# Patient Record
Sex: Male | Born: 1955 | Race: White | Hispanic: No | Marital: Married | Smoking: Former smoker
Health system: Southern US, Community
[De-identification: ages and names within clinical notes are randomized; demographics above are authoritative.]

## PROBLEM LIST (undated history)

## (undated) DIAGNOSIS — J449 Chronic obstructive pulmonary disease, unspecified: Secondary | ICD-10-CM

## (undated) DIAGNOSIS — J310 Chronic rhinitis: Secondary | ICD-10-CM

## (undated) DIAGNOSIS — J439 Emphysema, unspecified: Secondary | ICD-10-CM

## (undated) DIAGNOSIS — T7840XA Allergy, unspecified, initial encounter: Secondary | ICD-10-CM

## (undated) DIAGNOSIS — J45909 Unspecified asthma, uncomplicated: Secondary | ICD-10-CM

## (undated) HISTORY — DX: Allergy, unspecified, initial encounter: T78.40XA

## (undated) HISTORY — DX: Chronic obstructive pulmonary disease, unspecified: J44.9

## (undated) HISTORY — DX: Unspecified asthma, uncomplicated: J45.909

## (undated) HISTORY — DX: Emphysema, unspecified: J43.9

## (undated) HISTORY — DX: Chronic rhinitis: J31.0

---

## 2000-04-14 ENCOUNTER — Encounter: Admission: RE | Admit: 2000-04-14 | Discharge: 2000-04-14 | Payer: Self-pay | Admitting: Family Medicine

## 2000-04-14 ENCOUNTER — Encounter: Payer: Self-pay | Admitting: Family Medicine

## 2001-08-05 ENCOUNTER — Encounter: Admission: RE | Admit: 2001-08-05 | Discharge: 2001-08-05 | Payer: Self-pay | Admitting: Family Medicine

## 2001-08-05 ENCOUNTER — Encounter: Payer: Self-pay | Admitting: Family Medicine

## 2002-12-20 ENCOUNTER — Inpatient Hospital Stay (HOSPITAL_COMMUNITY): Admission: AD | Admit: 2002-12-20 | Discharge: 2002-12-23 | Payer: Self-pay | Admitting: Internal Medicine

## 2002-12-22 ENCOUNTER — Encounter: Payer: Self-pay | Admitting: Cardiovascular Disease

## 2005-01-04 ENCOUNTER — Ambulatory Visit: Payer: Self-pay | Admitting: Internal Medicine

## 2005-07-10 ENCOUNTER — Ambulatory Visit: Payer: Self-pay | Admitting: Internal Medicine

## 2006-02-14 ENCOUNTER — Ambulatory Visit: Payer: Self-pay | Admitting: Internal Medicine

## 2006-05-07 ENCOUNTER — Ambulatory Visit: Payer: Self-pay | Admitting: Internal Medicine

## 2006-07-03 ENCOUNTER — Ambulatory Visit: Payer: Self-pay | Admitting: Gastroenterology

## 2006-07-11 ENCOUNTER — Ambulatory Visit: Payer: Self-pay | Admitting: Gastroenterology

## 2007-03-30 ENCOUNTER — Ambulatory Visit: Payer: Self-pay | Admitting: Internal Medicine

## 2007-06-01 ENCOUNTER — Ambulatory Visit: Payer: Self-pay | Admitting: Internal Medicine

## 2007-06-05 ENCOUNTER — Ambulatory Visit: Payer: Self-pay | Admitting: Internal Medicine

## 2007-06-10 ENCOUNTER — Ambulatory Visit: Payer: Self-pay | Admitting: Internal Medicine

## 2007-07-01 ENCOUNTER — Ambulatory Visit: Payer: Self-pay | Admitting: Internal Medicine

## 2007-07-02 ENCOUNTER — Ambulatory Visit: Payer: Self-pay | Admitting: Internal Medicine

## 2007-09-04 ENCOUNTER — Ambulatory Visit: Payer: Self-pay | Admitting: Internal Medicine

## 2007-10-27 DIAGNOSIS — J449 Chronic obstructive pulmonary disease, unspecified: Secondary | ICD-10-CM

## 2007-10-27 DIAGNOSIS — J310 Chronic rhinitis: Secondary | ICD-10-CM

## 2007-11-24 ENCOUNTER — Ambulatory Visit: Payer: Self-pay | Admitting: Internal Medicine

## 2007-12-11 ENCOUNTER — Telehealth (INDEPENDENT_AMBULATORY_CARE_PROVIDER_SITE_OTHER): Payer: Self-pay | Admitting: *Deleted

## 2008-01-04 ENCOUNTER — Telehealth (INDEPENDENT_AMBULATORY_CARE_PROVIDER_SITE_OTHER): Payer: Self-pay | Admitting: *Deleted

## 2008-02-22 ENCOUNTER — Ambulatory Visit: Payer: Self-pay | Admitting: Internal Medicine

## 2008-06-29 ENCOUNTER — Ambulatory Visit: Payer: Self-pay | Admitting: Internal Medicine

## 2009-02-22 ENCOUNTER — Telehealth: Payer: Self-pay | Admitting: Internal Medicine

## 2009-03-07 ENCOUNTER — Ambulatory Visit: Payer: Self-pay | Admitting: Internal Medicine

## 2009-09-04 ENCOUNTER — Ambulatory Visit: Payer: Self-pay | Admitting: Internal Medicine

## 2010-02-13 ENCOUNTER — Ambulatory Visit: Payer: Self-pay | Admitting: Internal Medicine

## 2010-02-19 ENCOUNTER — Telehealth (INDEPENDENT_AMBULATORY_CARE_PROVIDER_SITE_OTHER): Payer: Self-pay | Admitting: *Deleted

## 2010-02-27 ENCOUNTER — Telehealth: Payer: Self-pay | Admitting: Adult Health

## 2010-02-27 ENCOUNTER — Ambulatory Visit: Payer: Self-pay | Admitting: Internal Medicine

## 2010-04-13 ENCOUNTER — Telehealth (INDEPENDENT_AMBULATORY_CARE_PROVIDER_SITE_OTHER): Payer: Self-pay | Admitting: *Deleted

## 2010-07-10 ENCOUNTER — Ambulatory Visit: Payer: Self-pay | Admitting: Internal Medicine

## 2010-07-10 DIAGNOSIS — R5381 Other malaise: Secondary | ICD-10-CM | POA: Insufficient documentation

## 2010-07-10 DIAGNOSIS — R5383 Other fatigue: Secondary | ICD-10-CM

## 2010-07-11 ENCOUNTER — Telehealth (INDEPENDENT_AMBULATORY_CARE_PROVIDER_SITE_OTHER): Payer: Self-pay | Admitting: *Deleted

## 2010-07-11 LAB — CONVERTED CEMR LAB
Basophils Relative: 0.6 % (ref 0.0–3.0)
CO2: 31 meq/L (ref 19–32)
Chloride: 106 meq/L (ref 96–112)
Eosinophils Relative: 1.6 % (ref 0.0–5.0)
Lymphocytes Relative: 19.9 % (ref 12.0–46.0)
MCV: 100 fL (ref 78.0–100.0)
Monocytes Absolute: 0.7 10*3/uL (ref 0.1–1.0)
Monocytes Relative: 7.2 % (ref 3.0–12.0)
Neutrophils Relative %: 70.7 % (ref 43.0–77.0)
Platelets: 303 10*3/uL (ref 150.0–400.0)
Potassium: 4.4 meq/L (ref 3.5–5.1)
RBC: 4.14 M/uL — ABNORMAL LOW (ref 4.22–5.81)
Sed Rate: 21 mm/hr (ref 0–22)
Sodium: 143 meq/L (ref 135–145)
TSH: 1.4 microintl units/mL (ref 0.35–5.50)
WBC: 9.1 10*3/uL (ref 4.5–10.5)

## 2010-08-08 ENCOUNTER — Encounter: Admission: RE | Admit: 2010-08-08 | Discharge: 2010-08-08 | Payer: Self-pay | Admitting: Family Medicine

## 2010-08-11 ENCOUNTER — Encounter: Payer: Self-pay | Admitting: Internal Medicine

## 2010-08-11 DIAGNOSIS — R509 Fever, unspecified: Secondary | ICD-10-CM

## 2010-08-20 ENCOUNTER — Encounter (INDEPENDENT_AMBULATORY_CARE_PROVIDER_SITE_OTHER): Payer: Self-pay | Admitting: *Deleted

## 2010-08-29 ENCOUNTER — Ambulatory Visit: Payer: Self-pay | Admitting: Internal Medicine

## 2010-08-29 ENCOUNTER — Encounter: Payer: Self-pay | Admitting: Internal Medicine

## 2011-01-08 NOTE — Progress Notes (Signed)
Summary: Rx failed doxy, rx levaquin and 6 days prednisone  Phone Note Call from Patient Call back at Home Phone [phone]   Caller: Demetra Shiner Call For: wert Reason for Call: Talk to Nurse Summary of Call: not getting any better,more chest congestion, low grade fever of 99, cough, weak.  Can you call in something else? [facility]. Initial call taken by: Eugene Gavia,  February 19, 2010 2:25 PM  Follow-up for Phone Call        needs ov with all meds if no better. LMTCB x 1 Vernie Murders  February 19, 2010 2:31 PM   pt states MW told him that if he was no better to let him know and he would call in something else. pt ahs one day left of prednisone and doxycycline. Pt staes slight improvement in congestion, but otherwise no improvement. Pt c/o productive cough with yellow phlegm, low grade fever, chest congestion. Pt advised MW not in office unti tomorrow. Pt ok with waiting. Please advise. Carron Curie CMA  February 19, 2010 4:17 PM ok to start Levquin 750 mg one daily x 5 days and prednisone 10- mg 4 each am x 2days, 2x2days, 1x2days and stop  Follow-up by: Nyoka Cowden MD,  February 19, 2010 4:39 PM  Additional Follow-up for Phone Call Additional follow up Details #1::        rxs sent to pharm. Spoke with pt and made aware this was done. Additional Follow-up by: Vernie Murders,  February 19, 2010 4:50 PM    New/Updated Medications: PREDNISONE 10 MG TABS (PREDNISONE) 4 x 2 days, 2 x 2 days, 1 x 2 days and stop LEVAQUIN 750 MG TABS (LEVOFLOXACIN) 1 by mouth x 5 days Prescriptions: LEVAQUIN 750 MG TABS (LEVOFLOXACIN) 1 by mouth x 5 days  #5 x 0   Entered by:   Vernie Murders   Authorized by:   Nyoka Cowden MD   Signed by:   Vernie Murders on 02/19/2010   Method used:   Electronically to        [facility]. [phone]* (retail)       [address]       [place], [place]  [address]       Ph: [phone]       Fax:  [phone]   RxID:   [id number] PREDNISONE 10 MG TABS (PREDNISONE) 4 x 2 days, 2 x 2 days, 1 x 2 days and stop  #10 x 0   Entered by:   Vernie Murders   Authorized by:   Nyoka Cowden MD   Signed by:   Vernie Murders on 02/19/2010   Method used:   Electronically to        [facility]. [phone]* (retail)       [address]       [place], [place]  [address]       Ph: [phone]       Fax: [phone]   RxID:   [id number]

## 2011-01-08 NOTE — Progress Notes (Signed)
Summary: fax labs  Phone Note From Other Clinic   Caller: pt Call For: wert Summary of Call: please fax labs to Dr. Perrin Maltese fax# [phone] Initial call taken by: Eugene Gavia,  July 11, 2010 11:28 AM  Follow-up for Phone Call        labs and cxr faxed to dr guest Follow-up by: Philipp Deputy CMA,  July 12, 2010 11:23 AM

## 2011-01-08 NOTE — Progress Notes (Signed)
Summary: refill  Phone Note Call from Patient   Caller: Patient Call For: wert Summary of Call: need proair refilled [facility] [phone] Initial call taken by: Rickard Patience,  Apr 13, 2010 8:37 AM  Follow-up for Phone Call        Pt's wife informed that refill was sent to pharmacy. Abigail Miyamoto RN  Apr 13, 2010 8:50 AM     Prescriptions: PROAIR HFA 108 (90 BASE) MCG/ACT  AERS (ALBUTEROL SULFATE) 1-2 puffs every 4-6 hours as needed  #1 x 6   Entered by:   Abigail Miyamoto RN   Authorized by:   Nyoka Cowden MD   Signed by:   Abigail Miyamoto RN on 04/13/2010   Method used:   Electronically to        [facility]. [phone]* (retail)       [address]       [place], [place]  [address]       Ph: [phone]       Fax: [phone]   RxID:   [id number]

## 2011-01-08 NOTE — Assessment & Plan Note (Signed)
Summary: fever, no better//jrc   Primary Provider/Referring Provider:  Guest  CC:  fever earlier today of 99.8, chest congestion and coughing up yellow sputum, and wheezing and tightness in chest.   Pt denied increased sob and sore throat.  Pt has finished 2 rounds of abx and pred taper. .  History of Present Illness: 52  yowm quit smoking 2003  with  COPD PFTs 4/08 FEV1 67% ratio 53% diffusing capacity 98%  improved but  has plateaued at present doe heavy exertion does ok in yard , walks but no jogging but can do treadmill.  March 07, 2009 ov no longer needing rescue rx  but required round prednisone and abx 2/10 he attributed to cold damp weather.   September 04, 2009 ov no need for rescue, able to ex aerobically x 30-40 min  February 13, 2010 Acute visit.  Pt c/o "head cold" x 4 days.  He c/o prod cough with yellow sputum and chest congestion with sore throat.  February 27, 2010--Presents for an acute office visit.  Seen on 3/8 for URI-tx w/ doxcycline and steroid taper. NO better, 3/14 called in levaquin 750x5d, /steroids-some better today but only minimally improved, still running fever-99.8 , cough w/ clear to yellow mucus, wheezing. Denies chest pain, dyspnea, orthopnea, hemoptysis,  n/v/d. Cough is getting better. Most concerned why he is not completely better and running low grade fevers. Has jury duty tomorrow.   Medications Prior to Update: 1)  Spiriva Handihaler 18 Mcg  Caps (Tiotropium Bromide Monohydrate) .... Use Once Daily 2)  Qvar 80 Mcg/act  Aers (Beclomethasone Dipropionate) .... Take 2 Puffs Two Times A Day 3)  Proair Hfa 108 (90 Base) Mcg/act  Aers (Albuterol Sulfate) .Marland Kitchen.. 1-2 Puffs Every 4-6 Hours As Needed 4)  Doxycycline Hyclate 100 Mg Caps (Doxycycline Hyclate) .... One Twice Daily X 7 Days 5)  Prednisone 10 Mg  Tabs (Prednisone) .... 4 Each Am X 2days, 2x2days, 1x2days and Stop 6)  Prednisone 10 Mg Tabs (Prednisone) .... 4 X 2 Days, 2 X 2 Days, 1 X 2 Days and Stop 7)   Levaquin 750 Mg Tabs (Levofloxacin) .Marland Kitchen.. 1 By Mouth X 5 Days  Current Medications (verified): 1)  Spiriva Handihaler 18 Mcg  Caps (Tiotropium Bromide Monohydrate) .... Use Once Daily 2)  Qvar 80 Mcg/act  Aers (Beclomethasone Dipropionate) .... Take 2 Puffs Two Times A Day 3)  Proair Hfa 108 (90 Base) Mcg/act  Aers (Albuterol Sulfate) .Marland Kitchen.. 1-2 Puffs Every 4-6 Hours As Needed  Allergies (verified): 1)  ! Penicillin 2)  ! Wellbutrin  Past History:  Past Medical History: Last updated: 02/13/2010 COPD (ICD-496)    - PFT's 4/08  FEV 67% with ratio of 53 and DLC0 98%   - HFA 75% March 07, 2009 > 100 % September 04, 2009 > confirmed February 13, 2010  RHINITIS (ICD-472.0)    Family History: Last updated: 02/22/2008 positive for emphysema and lung cancer in his mother who was a smoker  Social History: Last updated: 02/22/2008 Patient states former smoker, quit 12/03  Risk Factors: Smoking Status: quit (02/22/2008)  Review of Systems      See HPI  Vital Signs:  Patient profile:   55 year old male Height:      68 inches Weight:      182.50 pounds BMI:     27.85 O2 Sat:      99 % on Room air Temp:     99.1 degrees F oral Pulse rate:  106 / minute BP sitting:   108 / 70  (left arm) Cuff size:   regular  Vitals Entered By: Arman Filter LPN (February 27, 2010 2:44 PM)  O2 Flow:  Room air CC: fever earlier today of 99.8, chest congestion and coughing up yellow sputum, wheezing and tightness in chest.   Pt denied increased sob and sore throat.  Pt has finished 2 rounds of abx and pred taper.  Is Patient Diabetic? No Comments Medications reviewed with patient Arman Filter LPN  February 27, 2010 2:44 PM    Physical Exam  Additional Exam:    Pleasant ambulatory white male in no acute distress  187 March 07, 2009  >  186 September 04, 2009 > 182 February 13, 2010 >>182 February 27, 2010 HEENT mild turbinate edema.  Oropharynx no thrush or excess pnd or cobblestoning.  No JVD or cervical  adenopathy. Mild accessory muscle hypertrophy. Trachea midline, nl thryroid. Chest was hyperinflated by percussion with diminished breath sounds and moderate increased exp time without wheeze. Hoover sign positive at mid inspiration. Regular rate and rhythm without murmur gallop or rub or increase P2 no edema.   Abd: no hsm, nl excursion. Ext warm without cyanosis or clubbing.      Impression & Recommendations:  Problem # 1:  COPD (ICD-496) Slow to resolve flare: CXR : today is clear w/ no sign of PNA  No further abx /steroids at this time.  REC:  Mucinex DM two times a day as needed cough/congestion  Saline nasal rinses as needed  Rinse and gargle after inhaler use Please contact office for sooner follow up if symptoms do not improve or worsen   Complete Medication List: 1)  Spiriva Handihaler 18 Mcg Caps (Tiotropium bromide monohydrate) .... Use once daily 2)  Qvar 80 Mcg/act Aers (Beclomethasone dipropionate) .... Take 2 puffs two times a day 3)  Proair Hfa 108 (90 Base) Mcg/act Aers (Albuterol sulfate) .Marland Kitchen.. 1-2 puffs every 4-6 hours as needed  Other Orders: T-2 View CXR (71020TC) Est. Patient Level IV ([address]109)  Patient Instructions: 1)  Mucinex DM two times a day as needed cough/congestion  2)  Saline nasal rinses as needed  3)  Rinse and gargle after inhaler use 4)  Please contact office for sooner follow up if symptoms do not improve or worsen

## 2011-01-08 NOTE — Assessment & Plan Note (Signed)
Summary: Pulmonary/ acute ext ov   Primary Provider/Referring Provider:  Guest  CC:  Acute visit.  Pt c/o persistant low grade fever since 07/01/10- noticed fever mainly in the afternoon.  He also has had slight increase in SOB and some prod cough in the am with minimal yellow sputum.Marland Kitchen  History of Present Illness: 52  yowm quit smoking 2003  with  COPD PFTs 4/08 FEV1 67% ratio 53% diffusing capacity 98%  improved but  has plateaued at present doe heavy exertion does ok in yard , walks but no jogging but can do treadmill.  March 07, 2009 ov no longer needing rescue rx  but required round prednisone and abx 2/10 he attributed to cold damp weather.   September 04, 2009 ov no need for rescue, able to ex aerobically x 30-40 min  February 13, 2010 Acute visit.  Pt c/o "head cold" x 4 days.  He c/o prod cough with yellow sputum and chest congestion with sore throat.  February 27, 2010--Presents for an acute office visit.  Seen on 3/8 for URI-tx w/ doxcycline and steroid taper. NO better, 3/14 called in levaquin 750x5d, /steroids-some better today but only minimally improved, still running fever-99.8 , cough w/ clear to yellow mucus, wheezing. Denies chest pain, dyspnea, orthopnea, hemoptysis,  n/v/d. Cough is getting better. Most concerned why he is not completely better and running low grade fevers. Has jury duty tomorrow.   July 10, 2010 Acute visit.  Pt c/o persistant low grade fever since 07/01/10- noticed fever mainly in the afternoon.  He also has had slight increase in SOB and some prod cough in the am with minimal yellow sputum.  no sinus c/o's no reflux, skin rash. has not used rescue rx over baseline. Pt denies any significant sore throat, dysphagia, itching, sneezing,  nasal congestion or excess secretions,  fever, chills, sweats, unintended wt loss, pleuritic or exertional cp, hempoptysis, change in activity tolerance  orthopnea pnd or leg swelling   Current Medications (verified): 1)  Spiriva  Handihaler 18 Mcg  Caps (Tiotropium Bromide Monohydrate) .... Use Once Daily 2)  Qvar 80 Mcg/act  Aers (Beclomethasone Dipropionate) .... Take 2 Puffs Two Times A Day 3)  Proair Hfa 108 (90 Base) Mcg/act  Aers (Albuterol Sulfate) .Marland Kitchen.. 1-2 Puffs Every 4-6 Hours As Needed  Allergies (verified): 1)  ! Penicillin 2)  ! Wellbutrin  Past History:  Past Medical History: COPD (ICD-496)    -  PFT's 4/08  FEV 67% with ratio of 53 and DLC0 98%   - HFA 75% March 07, 2009 > 100 % September 04, 2009 > confirmed February 13, 2010  RHINITIS (ICD-472.0)    Vital Signs:  Patient profile:   55 year old male Weight:      184 pounds O2 Sat:      96 % on Room air Temp:     99.3 degrees F oral Pulse rate:   119 / minute BP sitting:   118 / 84  (left arm)  Vitals Entered By: Vernie Murders (July 10, 2010 3:58 PM)  O2 Flow:  Room air  Physical Exam  Additional Exam:    Pleasant ambulatory white male in no acute distress   wt 187 March 07, 2009  >  186 September 04, 2009 > 182 February 13, 2010 >>182 February 27, 2010 > 184 July 10, 2010  HEENT mild turbinate edema.  Oropharynx no thrush or excess pnd or cobblestoning.  No JVD or  cervical adenopathy. Mild accessory muscle hypertrophy. Trachea midline, nl thryroid. Chest was hyperinflated by percussion with diminished breath sounds and moderate increased exp time with trace end exp wheeze. Hoover sign positive at mid inspiration. Regular rate and rhythm without murmur gallop or rub or increase P2 no edema.   Abd: no hsm, nl excursion. Ext warm without cyanosis or clubbing.       White Cell Count          9.1 K/uL                    4.5-10.5   Red Cell Count       [L]  4.14 Mil/uL                 4.22-5.81   Hemoglobin                14.6 g/dL                   57.8-46.9   Hematocrit                41.4 %                      39.0-52.0   MCV                       100.0 fl                    78.0-100.0   MCHC                      35.3 g/dL                    62.9-52.8   RDW                       13.4 %                      11.5-14.6   Platelet Count            303.0 K/uL                  150.0-400.0   Neutrophil %              70.7 %                      43.0-77.0   Lymphocyte %              19.9 %                      12.0-46.0   Monocyte %                7.2 %                       3.0-12.0   Eosinophils%              1.6 %                       0.0-5.0   Basophils %               0.6 %  0.0-3.0   Neutrophill Absolute      6.4 K/uL                    1.4-7.7   Lymphocyte Absolute       1.8 K/uL                    0.7-4.0   Monocyte Absolute         0.7 K/uL                    0.1-1.0  Eosinophils, Absolute                             0.1 K/uL                    0.0-0.7   Basophils Absolute        0.1 K/uL                    0.0-0.1  Tests: (2) TSH (TSH)   FastTSH                   1.40 uIU/mL                 0.35-5.50  Tests: (3) Sed Rate (ESR)   Sed Rate                  21 mm/hr                    0-22  Tests: (4) BMP (METABOL)   Sodium                    143 mEq/L                   135-145   Potassium                 4.4 mEq/L                   3.5-5.1   Chloride                  106 mEq/L                   96-112   Carbon Dioxide            31 mEq/L                    19-32   Glucose                   75 mg/dL                    57-84   BUN                       14 mg/dL                    6-96   Creatinine                0.9 mg/dL                   2.9-5.2   Calcium                   9.5 mg/dL  8.4-10.5   GFR                       91.95 mL/min                >60  CXR  Procedure date:  07/10/2010  Findings:        Comparison: Chest x-ray of 02/27/2010   Findings: The lungs are clear and slightly hyperaerated.  Mild right apical pleural thickening is stable.  Mediastinal contours are stable.  The heart is within normal limits in size.  No bony abnormality is seen.    IMPRESSION: Stable chest x-ray.  No active lung disease.  Impression & Recommendations:  Problem # 1:  COPD (ICD-496) Mild exac with underuse of albuterol and somewhat atypical symptoms   Each maintenance medication was reviewed in detail including most importantly the difference between maintenance and as needed and under what circumstances the prns are to be used.  I spent extra time with the patient today explaining optimal mdi  technique.  This improved from  75-90% effective  Problem # 2:  FATIQUE AND MALAISE (ICD-780.79)  assoc with relative tachycardia but tsh, hgb nl and no evidence infection, no abx indicated  Other Orders: T-2 View CXR (71020TC) Est. Patient Level IV (16109) TLB-CBC Platelet - w/Differential (85025-CBCD) TLB-TSH (Thyroid Stimulating Hormone) (84443-TSH) TLB-Sedimentation Rate (ESR) (85652-ESR) TLB-BMP (Basic Metabolic Panel-BMET) (80048-METABOL)  Patient Instructions: 1)  I will call you with results 2)  use your rescue inhaler as needed up to every 4 hours when short of breath or tight

## 2011-01-08 NOTE — Assessment & Plan Note (Signed)
Summary: new pt fuo x 3 1/2 weeks   Primary Jared [name]:  Guest  CC:  low grade fevers .  History of Present Illness: Jared [name] is a 55 year old Jared [name] who was referred to me by Dr. Elvina Sidle for evaluation of fever.  He says that other than his COPD he has been in very good health until about 2 1/2 months ago.  One week and he felt like he was getting a fever and developed a mild frontal headache.  He took his temperature and found it to be 99 which he thinks is elevated over what is normal for him.  Since that time he has had daily problems with similar symptoms.  He says each afternoon he will feel feverish with the low grade headache and off and slight aching in his muscles and joints. He feels quite fatigued.  He goes to bed at his usual time, has no difficulty falling asleep or staying asleep but feels tired as soon as he wakes.  He has left work early on multiple occasions since he got sick. He does not nap during the day but wishes he could.  He says that he also frequently feels "fuzzyheaded." He has given up some of his regular exercise because he is not feeling well.  He saw his pulmonologist, Dr. Sherene Sires, on August 2.  A CBC, chemistries, TSH and chest x-ray were unremarkable.  He [facility] and again on August 17.  His temperatures at that time were 99 and 99.8.  He was told to keep a daily temperature along which he has done for the past 3 weeks.  He has had one recorded temp over 100 and that was 100.1 on September 19.  Repeat blood work including CBC, liver enzymes, sed rate, TSH, Lyme, Erlichia and RMSF serologies are all normal.  a CT scan of his abdomen and pelvis was normal as well.  His urinalysis was normal.  Preventive Screening-Counseling & Management  Alcohol-Tobacco     Alcohol drinks/day: 1     Alcohol type: beer     Smoking Status: never     Year Quit: quit a number of years  aro  Caffeine-Diet-Exercise     Caffeine use/day: yes     Does Patient Exercise: no  Safety-Violence-Falls     Seat Belt Use: yes   Prior Medication List:  SPIRIVA HANDIHALER 18 MCG  CAPS (TIOTROPIUM BROMIDE MONOHYDRATE) use once daily QVAR 80 MCG/ACT  AERS (BECLOMETHASONE DIPROPIONATE) take 2 puffs two times a day PROAIR HFA 108 (90 BASE) MCG/ACT  AERS (ALBUTEROL SULFATE) 1-2 puffs every 4-6 hours as needed   Current Allergies (reviewed today): ! PENICILLIN ! [facility] ! ASA Review of Systems       The patient complains of weight gain.  The patient denies anorexia, weight loss, chest pain, dyspnea on exertion, prolonged cough, abdominal pain, and suspicious skin lesions.    Vital Signs:  Patient profile:   55 year old male Height:      68 inches (172.72 cm) Weight:      185.25 pounds (84.20 kg) BMI:     28.27 Temp:     98.3 degrees F (36.83 degrees C) oral Pulse rate:   114 / minute BP sitting:   144 / 93  (left arm) Cuff size:   regular  Vitals Entered By: Jennet Maduro RN (August 29, 2010 1:41 PM) CC: low grade fevers  Is Patient Diabetic? No Pain Assessment Patient in pain? no      Nutritional Status BMI of 25 - 29 = overweight Nutritional Status Detail appetite "good"  Have you ever been in a relationship where you felt threatened, hurt or afraid?not asked wife present   Does patient need assistance? Functional Status Self care Ambulation Normal   Physical Exam  General:  alert and well-nourished.   Eyes:  no injection.   Mouth:  good dentition, pharynx pink and moist, no erythema, and no exudates.   Neck:  supple.   Lungs:  normal breath sounds, no crackles, and no wheezes.   Heart:  normal rate, regular rhythm, and no murmur.   Abdomen:  soft, non-tender, normal bowel sounds, no masses, no hepatomegaly, and no splenomegaly.   Msk:  normal ROM, no joint tenderness, no joint swelling, no joint warmth, and no redness over joints.   Neurologic:   alert & oriented X3, strength normal in all extremities, and gait normal.   Skin:  no rashes.   Cervical Nodes:  no anterior cervical adenopathy and no posterior cervical adenopathy.   Axillary Nodes:  no R axillary adenopathy and no L axillary adenopathy.   Psych:  normally interactive, good eye contact, not anxious appearing, and not depressed appearing.     Impression & Recommendations:  Problem # 1:  FATIQUE AND MALAISE (ICD-780.79) [name] looks good but feels bad.  He has had a distinct but nonspecific change in his health in the last 2 1/2 months.  Although he feels feverish I am not convinced he is actually having a febrile illness.  All of his recorded temperatures are well within the expected normal range.  I had a long discussion with him about the possibility that no one could tell him exactly what is making him sick at this time or necessarily make it all go away.  I have suggested that he be reassured by the extensive normal evaluations he's had so far.  I suggested that he stop taking his temperature, try to resume some of his normal physical activity and use either acetaminophen for NSAIDs preemptively over the next week to see if he can reduce the sensations of feverishness and headache so that he feels better.  I feel like it is better for him to try to manage his symptoms and wait this out rather than continue to pursue finding a name for his condition.  He seems to be in agreement with this recommendation.  I offered a follow-up visit but he said that he would simply see how this plan does and call or return if he is not feeling better and has further questions. Orders: Consultation Level IV (59563)  Patient Instructions: 1)  Please schedule a follow-up appointment as needed.

## 2011-01-08 NOTE — Assessment & Plan Note (Signed)
Summary: Pulmonary/ acute ov uri rx doxy and pred6   Primary Provider/Referring Provider:  Guest  CC:  Acute visit.  Pt c/o "head cold" x 4 days.  He c/o prod cough with yellow sputum and chest congestion.  Marland Kitchen  History of Present Illness: 52  yowm quit smoking 2003  with  COPD PFTs 4/08 FEV1 67% ratio 53% diffusing capacity 98%  improved but  has plateaued at present doe heavy exertion does ok in yard , walks but no jogging but can do treadmill.  March 07, 2009 ov no longer needing rescue rx  but required round prednisone and abx 2/10 he attributed to cold damp weather.   September 04, 2009 ov no need for rescue, able to ex aerobically x 30-40 min  February 13, 2010 Acute visit.  Pt c/o "head cold" x 4 days.  He c/o prod cough with yellow sputum and chest congestion with sore throat.  Pt denies any significant  dysphagia, itching, sneezing,  nasal congestion or excess secretions,  fever, chills, sweats, unintended wt loss, pleuritic or exertional cp, hempoptysis, change in activity tolerance  orthopnea pnd or leg swelling.  Current Medications (verified): 1)  Spiriva Handihaler 18 Mcg  Caps (Tiotropium Bromide Monohydrate) .... Use Once Daily 2)  Qvar 80 Mcg/act  Aers (Beclomethasone Dipropionate) .... Take 2 Puffs Two Times A Day 3)  Proair Hfa 108 (90 Base) Mcg/act  Aers (Albuterol Sulfate) .Marland Kitchen.. 1-2 Puffs Every 4-6 Hours As Needed  Allergies (verified): 1)  ! Penicillin 2)  ! Wellbutrin  Past History:  Past Medical History: COPD (ICD-496)    - PFT's 4/08  FEV 67% with ratio of 53 and DLC0 98%   - HFA 75% March 07, 2009 > 100 % September 04, 2009 > confirmed February 13, 2010  RHINITIS (ICD-472.0)    Vital Signs:  Patient profile:   55 year old male Weight:      182 pounds BMI:     27.77 O2 Sat:      98 % on Room air Temp:     97.6 degrees F oral Pulse rate:   96 / minute BP sitting:   116 / 78  (left arm)  Vitals Entered By: Vernie Murders (February 13, 2010 11:58 AM)  O2 Flow:   Room air  Physical Exam  Additional Exam:    Pleasant ambulatory white male in no acute distress  187 March 07, 2009  >  186 September 04, 2009 > 182 February 13, 2010  HEENT mild turbinate edema.  Oropharynx no thrush or excess pnd or cobblestoning.  No JVD or cervical adenopathy. Mild accessory muscle hypertrophy. Trachea midline, nl thryroid. Chest was hyperinflated by percussion with diminished breath sounds and moderate increased exp time without wheeze. Hoover sign positive at mid inspiration. Regular rate and rhythm without murmur gallop or rub or increase P2 no edema.   Abd: no hsm, nl excursion. Ext warm without cyanosis or clubbing.      Impression & Recommendations:  Problem # 1:  COPD (ICD-496) Mild exac in setting of uri I spent extra time with the patient today explaining optimal mdi  technique.  This improved from  90-100%   Each maintenance medication was reviewed in detail including most importantly the difference between maintenance and as needed and under what circumstances the prns are to be used. See instructions for specific recommendations   Medications Added to Medication List This Visit: 1)  Doxycycline Hyclate 100 Mg Caps (Doxycycline  hyclate) .... One twice daily x 7 days 2)  Prednisone 10 Mg Tabs (Prednisone) .... 4 each am x 2days, 2x2days, 1x2days and stop  Other Orders: Est. Patient Level III (15176)  Patient Instructions: 1)  Proaire can be used up to 2 puffs every 4hours 2)  Doxycycline 100 mg one twice daily x 7d 3)  Prednisone 4 each am x 2days, 2x2days, 1x2days and stop  4)  Pepcid 20mg  or Zantac 150 mg at bedtime while exacerbating 5)  GERD (REFLUX)  is a common cause of respiratory symptoms. It commonly presents without heartburn and can be treated with medication, but also with lifestyle changes including avoidance of late meals, excessive alcohol, smoking cessation, and avoid fatty foods, chocolate, peppermint, colas, red wine, and acidic juices such  as orange juice. NO MINT OR MENTHOL PRODUCTS SO NO COUGH DROPS  6)  USE SUGARLESS CANDY INSTEAD (jolley ranchers)  7)  NO OIL BASED VITAMINS  Prescriptions: PREDNISONE 10 MG  TABS (PREDNISONE) 4 each am x 2days, 2x2days, 1x2days and stop  #14 x 0   Entered and Authorized by:   Nyoka Cowden MD   Signed by:   Nyoka Cowden MD on 02/13/2010   Method used:   Electronically to        [facility]. [phone]* (retail)       [address]       [place], [place]  [address]       Ph: [phone]       Fax: [phone]   RxID:   [id number] DOXYCYCLINE HYCLATE 100 MG CAPS (DOXYCYCLINE HYCLATE) one twice daily x 7 days  #14 x 0   Entered and Authorized by:   Nyoka Cowden MD   Signed by:   Nyoka Cowden MD on 02/13/2010   Method used:   Electronically to        [facility]. [phone]* (retail)       [address]       [place], [place]  [address]       Ph: [phone]       Fax: [phone]   RxID:   [id number]

## 2011-01-08 NOTE — Miscellaneous (Signed)
Summary: Problems and allergies updated  Clinical Lists Changes  Problems: Added new problem of FEVER UNSPECIFIED (ICD-780.60) Allergies: Added new allergy or adverse reaction of ASA

## 2011-01-08 NOTE — Progress Notes (Signed)
Summary: ov, pt no better  Phone Note Call from Patient   Caller: wife Call For: wert Summary of Call: per pt wife, pt fever has gone up andhe still has productive cough with yellow phlegm. Pt has finsihed 2nd round of abx and has one day left of pred taper. Advised pt needed an ov since no better. Per JJ ok touse tp 2:45. pt aware.   Initial call taken by: Carron Curie CMA,  February 27, 2010 1:04 PM

## 2011-01-08 NOTE — Consult Note (Signed)
Summary: New Pt. Referral: Dr. Belva Bertin  New Pt. Referral: Dr. Belva Bertin   Imported By: Florinda Marker 09/03/2010 16:28:33  _____________________________________________________________________  External Attachment:    Type:   Image     Comment:   External Document

## 2011-02-13 ENCOUNTER — Encounter: Payer: Self-pay | Admitting: *Deleted

## 2011-04-20 ENCOUNTER — Other Ambulatory Visit: Payer: Self-pay | Admitting: Internal Medicine

## 2011-04-23 ENCOUNTER — Other Ambulatory Visit: Payer: Self-pay | Admitting: Internal Medicine

## 2011-04-23 NOTE — Assessment & Plan Note (Signed)
[facility]                             PULMONARY OFFICE NOTE   NAME:Jared [name], Jared [name]                    MRN:          [id number]  DATE:06/01/2007                            DOB:          [birth year]    HISTORY OF PRESENT ILLNESS:  Patient is a 55 year old white male patient  of Dr. Sherene Sires who has a known history of COPD, who presents today with a  two week history of productive cough with thick green-yellow sputum,  wheezing, and shortness of breath.  The patient has been having to  increase his albuterol use up to 3-4 times a day over the last week.  Patient denies any hemoptysis, orthopnea, PND, leg swelling.   PAST MEDICAL HISTORY:  Reviewed.   CURRENT MEDICATIONS:  Reviewed.   PHYSICAL EXAMINATION:  GENERAL:  Patient is a pleasant male in no acute  distress.  VITAL SIGNS:  He is afebrile with stable vital signs.  O2 saturation is  96% on room air.  HEENT:  Nasal mucosa is slightly pale and boggy.  Nontender sinuses.  Posterior pharynx is clear.  NECK:  Supple without cervical adenopathy.  No JVD.  LUNGS:  The lung sounds reveal a few scattered rhonchi and some  expiratory wheezes.  CARDIAC:  Regular rate and rhythm.  ABDOMEN:  Soft and nontender.  EXTREMITIES:  Warm without any edema.   IMPRESSION/PLAN:  Acute chronic obstructive pulmonary disease  exacerbation.  Patient is to begin Avelox x5 days, Mucinex DM twice  daily.  Patient may use saline nasal spray p.r.n.  Patient is to return  back with Dr. Sherene Sires as scheduled or sooner as needed.      Rubye Oaks, NP  Electronically Signed      Charlaine Dalton. Sherene Sires, MD, [facility]  Electronically Signed   TP/MedQ  DD: 06/01/2007  DT: 06/02/2007  Job #: [id number]

## 2011-04-23 NOTE — Assessment & Plan Note (Signed)
[facility]                             PULMONARY OFFICE NOTE   NAME:Jared [name], Jared [name]                    MRN:          [id number]  DATE:06/05/2007                            DOB:          September 11, 1956    HISTORY OF PRESENT ILLNESS:  This is a 55 year old white male patient of  Dr. Sherene Sires who has a known history of COPD who returns today related to  persistent cough and congestion.  The patient was seen in the office 4  days ago for an acute COPD exacerbation.  He was given a 5-day course of  Avelox and a prednisone taper.  The patient returns today reporting that  his symptoms have only minimally improved.  He continues to have a  productive cough with thick green sputum.  The patient denies any chest  pain, orthopnea, PND, or leg swelling.   PAST MEDICAL HISTORY:  Reviewed.   CURRENT MEDICATIONS:  Reviewed.   PHYSICAL EXAMINATION:  GENERAL:  The patient is a pleasant male in no  acute distress.  VITAL SIGNS:  Temperature is 99.7, blood pressure 108/62, O2 saturation  is 96% on room air.  HEENT:  Unremarkable.  NECK:  Supple, without cervical adenopathy.  No JVD.  LUNG SOUNDS:  Reveal course breath sounds bilaterally.  CARDIAC:  Regular rate.  ABDOMEN:  Soft and nontender.  EXTREMITIES:  Warm, without any edema.   Chest x-ray reveals no acute changes.   IMPRESSION AND PLAN:  Slow to resolve asthmatic bronchitic exacerbation.  The patient is to extend Avelox for an additional 3 days for a total of  8 days of therapy.  Add in Mucinex DM twice daily.  Finish prednisone.  The patient will return back with Dr. Sherene Sires as scheduled or sooner if  needed.  The patient is advised to contact the office for sooner  followup if symptoms do not improve or worsen.      Rubye Oaks, NP  Electronically Signed      Charlaine Dalton. Sherene Sires, MD, [facility]  Electronically Signed   TP/MedQ  DD: 06/09/2007  DT: 06/10/2007  Job #: [id number]

## 2011-04-23 NOTE — Assessment & Plan Note (Signed)
[facility]                             PULMONARY OFFICE NOTE   NAME:Jared, Jared Jared                    MRN:          [id number]  DATE:06/10/2007                            DOB:          12-14-1955    PULMONARY/EXTENDED FOLLOW-UP OFFICE VISIT:  A 55 year old white male with severe COPD with an asthmatic component,  status post smoking cessation in December 2003, with a definite flare-up  in his symptoms for the last several weeks, associated with discolored  sputum and a sensation of a sore wisdom tooth (he is having pain and  swelling below his left neck associated with palpable lymph nodes and  assumes it must be a wisdom tooth although he is not actually pain when  he chews).   He denies any overt sinus or reflux symptoms, pleuritic pain, fevers,  sweats, orthopnea, PND or leg swelling.   Reviewed his medications with him and note that he is still on  prednisone taper, down now to 10 mg per day, after a course of Avelox as  well.   PHYSICAL EXAMINATION:  He is a pleasant, ambulatory white male in no  acute distress.  Stable vital signs.  HEENT:  Unremarkable.  Oropharynx clear.  Lung fields reveal inspiratory and expiratory wheezing and rhonchi  bilaterally.  There is a regular rate and rhythm without murmur, gallop, or rub.  ABDOMEN:  Soft, benign.  EXTREMITIES:  Warm without calf tenderness, cyanosis or clubbing.   IMPRESSION:  Purulent tracheobronchitis may or may not be related to the  wisdom tooth.  However, since he has already taken a course of Avelox  without benefit, I am going to switch him over to Levaquin (his  pharmacist reports he is allergic to PENICILLIN, although we do not have  this on our records) and give him a 5-day course of Avelox, followed by  a sinus CT and neck CT scan should he continue to have swollen glands.   Since he is still wheezing despite optimal treatment with Qvar, I am  going to recommend switching  to Symbicort 160/4.5 mcg two puffs b.i.d.  and spent extra time teaching him optimal MDI technique today, which he  mastered fairly quickly.   Finally, I did give him a 6-day course of prednisone and arranged to see  him back in 2 weeks to check his progress.   In the meantime, I also reviewed with him optimal p.r.n. treatment with  albuterol and Mucinex-DM.     Jared Jared. Jared Sires, MD, [facility]  Electronically Signed    MBW/MedQ  DD: 06/10/2007  DT: 06/11/2007  Job #: [id number]   cc:   Jared Jared, M.D.

## 2011-04-23 NOTE — Assessment & Plan Note (Signed)
[facility]                             PULMONARY OFFICE NOTE   NAME:Jared [name], MARTINE TRAGESER                    MRN:          [id number]  DATE:09/04/2007                            DOB:          08-07-1956    HISTORY:  A very nice 55 year old white male with chronic obstructive  pulmonary disease with an asthmatic component plus intermittent rhinitis  that he is controlling with saline lavage.  He is maintained on Spiriva  1 daily and Qvar 80 two puffs b.i.d.  He could not tolerate Symbicort  because of muscle cramping, but now meets all of the goals that were set  out for him in terms of activity tolerance with no nocturnal complaints  nor need for rescue beta 2 therapy of any kind.   PHYSICAL EXAMINATION:  He is a pleasant, ambulatory white male in no  acute distress.  Afebrile with normal vital signs.  HEENT:  Reveals mild-to-moderate turbinate edema, nonspecific in nature.  Oropharynx is clear.  NECK:  Supple without cervical adenopathy or tenderness.  Trachea is  midline, no thyromegaly.  Lung fields perfectly clear bilaterally to auscultation and percussion  but breath sounds are diminished.  HEART:  Regular rhythm without murmurs, gallops, or rubs.  ABDOMEN:  Soft, benign.  EXTREMITIES:  Warm without calf tenderness, cyanosis, clubbing, or  edema.   IMPRESSION:  Chronic obstructive pulmonary disease with an asthmatic  component for which all the goals have been met in terms of control  without the need for bronchodilators, which do seem to be the cause of  his muscle cramps.   His rhinitis is also well controlled on nasal saline, although he may  need to add nasal steroids if he has frequent flare ups of nasal  complaints using the saline lavage.   Followup in this setting to be very 3 months, sooner if needed.     Charlaine Dalton. Sherene Sires, MD, [facility]  Electronically Signed    MBW/MedQ  DD: 09/04/2007  DT: 09/04/2007  Job #: [id number]   cc:    Jonita Albee, M.D.

## 2011-04-23 NOTE — Assessment & Plan Note (Signed)
[facility]                             PULMONARY OFFICE NOTE   NAME:Jared [name], Jared [name]                    MRN:          [id number]  DATE:07/01/2007                            DOB:          09/14/1956    HISTORY:  This is a 55 year old white male former smoker with chronic  asthma and persistent nasal congestion following recent URI. He had  received a prolonged course of fluoroquinolones with improvement, but  not completely back to baseline in terms of symptoms of sinus and chest  congestion that bothers him mostly in the mornings. He denies any  ongoing definite purulent sputum production, fevers, sweats, chills,  orthopnea, PND, or leg swelling.   His only medications at this point consist of Spiriva and Symbicort  160/4.5 2 puffs b.i.d. with minimum need for albuterol.   The only positive on his review of systems was foot and ankle cramping  that tend to occur late at night and in the early hours of the morning.   PHYSICAL EXAMINATION:  GENERAL:  He is a pleasant ambulatory white male  in no acute distress.  VITAL SIGNS:  Stable.  HEENT:  Moderate turbinate edema.  NECK:  Supple without cervical adenopathy or tenderness, trachea is  midline, no thyromegaly.  LUNGS:  Fields are perfectly clear bilaterally to auscultation and  percussion.  HEART:  Regular rate and rhythm with no murmurs, rubs, or gallops.  ABDOMEN:  Soft and benign.  EXTREMITIES:  Warm without calf tenderness, cyanosis, clubbing, or  edema.   IMPRESSION:  1. The asthmatic component of his problems appear to be well      controlled on his current regimen. Some of the foot cramping that      he is having at night might suggest excess beta-agonist from the      Symbicort since he is not using any rescue albuterol and I have      suggested that he might actually decrease the night-time dose of      Symbicort to see if it makes any difference in terms of foot      cramping  symptoms, and/or worsens his breathing.  2. Persistent congestion probably represents chronic rhinitis, but      may actually represent chronic sinusitis as well. I have given him      a chronic rhinitis flyer and reviewed him both text and graphic      formatted material. The pathophysiology of rhinitis/sinusitis for      now I am just going to recommend nasal saline irrigation with the      caveat that he avoid antihistamines in favor of Advil Cold and      Sinus and/or Mucinex DM for nasal symptoms (to avoid antihistamines      which may dry the secretions further).   Follow up will be in three months, with ENT referral in the meantime if  he has evidence of persistent sinusitis.     Charlaine Dalton. Sherene Sires, MD, [facility]  Electronically Signed    MBW/MedQ  DD: 07/01/2007  DT: 07/02/2007  Job #: [id number]

## 2011-04-26 NOTE — H&P (Signed)
NAME:  Jared [name], Jared [name]                       ACCOUNT NO.:  [id number]   MEDICAL RECORD NO.:  [id number]                   PATIENT TYPE:  INP   LOCATION:  0456                                 FACILITY:  [facility]   PHYSICIAN:  Charlaine Dalton. Sherene Sires, M.D. [facility]           DATE OF BIRTH:  1956-09-18   DATE OF ADMISSION:  12/20/2002  DATE OF DISCHARGE:                                HISTORY & PHYSICAL   REFERRING PHYSICIAN:  Dr. Talmadge Coventry.   CHIEF COMPLAINT:  Dyspnea.   HISTORY OF PRESENT ILLNESS:  This is a 55 year old white male, long-term  smoker, with a cough that dates back about five years, typically worse in  the morning, typically worse in the winter but present all year long,  intermittently productive of thick sputum that becomes discolored with  colds.  His present exacerbation of this cough began around Christmas time  and now, for the first time, has been associated with increasing dyspnea  mostly with activity but has become progressively worse, to the point where  he is becoming short of breath at rest, requiring a home nebulizer for the  first time.  He was seen at a walk-in clinic and apparently received a  course of clindamycin, although it is not clear how many doses he took for  swollen glands and sinus infection.  [facility] and called the paramedics, who came to his house recently, gave  him an extra breathing treatment and then left.  He has been also treated  with a course of prednisone, has apparently taken 20 mg a day.  He came to  the office today for evaluation, and I felt he needed to be admitted based  on the presence of panexpiratory wheeze within an hour of using his  nebulizer last.   The patient denies any pleuritic pain of any type, any overt sinus or reflux  symptoms, fevers, chills, or sweats.   PAST SURGICAL HISTORY:  None.   MAJOR MEDICAL ILLNESSES:  None.  (He denies any allergies or respiratory  problems  as a child.)   ALLERGIES:  PENICILLIN and WELLBUTRIN, nonspecific reaction.  Denies any  aspirin intolerance.   MEDICATIONS:  1. Prednisone 20 mg daily.  2. Albuterol nebulizer q.i.d.  3. Advair 500/50 1 b.i.d.  4. Nicorette patch.   SOCIAL HISTORY:  He quit smoking on December 05, 2002.  He had smoked  previously at two packs per day.  He works as a [employer] manager.  He  denies any unusual travel or hobby exposure.   FAMILY HISTORY:  Positive for emphysema in his mother, who was a smoker and  also had lung cancer apparently.   REVIEW OF SYSTEMS:  Taken in detail and essentially negative except for  recent swollen glands in his neck.   PHYSICAL EXAMINATION:  GENERAL:  This is a nontoxic- but uncomfortable-  appearing ambulatory white male in  no acute distress.  VITAL SIGNS:  Temperature was 100.1 in the office.  His pulse rate is 116.  Blood pressure is 120/80.  HEENT:  There was mild nasal turbinate edema.  Oropharynx was clear.  Dentition was intact.  Ear canals were clear bilaterally.  NECK:  Supple.  Without palpable cervical adenopathy or tenderness.  LUNGS:  Lung fields reveal panexpiratory wheezing bilaterally with marked  increase in expiratory time.  HEART:  Regular rate and rhythm without murmur, gallop, or rub present.  No  displacement of the PMI or increase in the pulmonic bump to S2.  ABDOMEN:  Soft, benign, with no palpable organomegaly, masses, or  tenderness.  EXTREMITIES:  Warm.  Without calf tenderness, cyanosis, clubbing, or edema.   LABORATORY DATA:  Chest x-ray reveals COPD changes only.   CBC revealed a WBC count of 12,800 with a left shift and no eosinophils.  CMET and BMP and TSH were all normal.   IMPRESSION:  Chronic bronchitis dating back about five years, now with an  acute exacerbation of asthmatic bronchitis that is refractory to outpatient  management with antibiotics, steroids, and nebulizers.  At this point he  will require admission  for high-dose IV steroids, around-the-clock  nebulizers, and will place him empirically on Tequin, having failed  clindamycin with persistent purulent sputum on this regimen.                                               Charlaine Dalton. Sherene Sires, M.D. [facility]    MBW/MEDQ  D:  12/21/2002  T:  12/21/2002  Job:  [id number]

## 2011-04-26 NOTE — Discharge Summary (Signed)
NAME:  Jared [name], Jared [name]                       ACCOUNT NO.:  [id number]   MEDICAL RECORD NO.:  [id number]                   PATIENT TYPE:  INP   LOCATION:  0456                                 FACILITY:  [facility]   PHYSICIAN:  Charlaine Dalton. Sherene Sires, M.D. [facility]           DATE OF BIRTH:  [birth year]   DATE OF ADMISSION:  12/20/2002  DATE OF DISCHARGE:  12/23/2002                                 DISCHARGE SUMMARY   FINAL DIAGNOSES:  1. Chronic obstructive pulmonary disease exacerbation secondary to     refractory asthmatic bronchitis.     a. Status post smoking cessation December 05, 2002.  2. 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     a. Echocardiogram normal this admission.     b. TSH normal this admission.   HISTORY AND PHYSICAL EXAMINATION:  Please see dictated H&P.  This patient  previously was a 55 year old white male smoker, but quit on December 28 with  symptoms of refractory asthmatic bronchitis that had really evolved since  Christmas of this year.  When I saw him in the office he was overusing 53 beta  2 agonists and still with pan expiratory wheezing despite being on  prednisone 20 mg daily.  I elected to place him in the hospital for control  of his airway disease with IV steroids and continue him on beta agonist in  the form of albuterol and Atrovent per nebulizer but unknown to me he also  continued to use Advair from his outpatient bag.  He had persistent  tachycardia on this.  An echocardiogram was obtained and a TSH was also  obtained which were normal.  However, when I discovered this problem on the  14th I stopped the nebulizer because he was ready at this point to go home  and on the morning of discharge he is ambulatory without significant dyspnea  and with resting pulse rate within normal limits.   His chest x-ray and CT scan of his sinuses showed no evidence of active  disease other than hyperinflation on chest x-ray.   Therefore, he is being discharged in  improved condition on the following  medications.   DISCHARGE MEDICATIONS:  1. Advair 500/50 one b.i.d.  2. Tequin 400 mg daily for four days and then stop (total of seven days of     therapy).  3. Prilosec 20 mg b.i.d. empirically before meals.  4. Prednisone 20 mg two q.a.m. for two days, and then one daily thereafter     until seen in the office.  5. For wheeze he can use Combivent two puffs q.4h. p.r.n. or use his home     nebulizer q.4h., but not both.  6. For cough he can use Guaifenesin DM two q.12h. p.r.n.  7. For nerves he can use Xanax 0.5 mg q.6h. p.r.n.   At time of discharge he only has wheezing with FVC maneuver.  With quiet  breathing or with pursed lip  maneuver there is no wheezing and his  saturations are adequate on room air.                                               Charlaine Dalton. Sherene Sires, M.D. [facility]    MBW/MEDQ  D:  12/23/2002  T:  12/23/2002  Job:  [id number]   cc:   Talmadge Coventry, M.D.  [address]  [place]  [place] [address]  Fax: [phone]

## 2011-04-26 NOTE — Assessment & Plan Note (Signed)
[facility]                             PULMONARY OFFICE NOTE   NAME:[name], Jared DOTY                    MRN:          [id number]  DATE:03/30/2007                            DOB:          02-Feb-1956    HISTORY:  A 55 year old white male with documented COPD status post  smoke cessation in 2003 returns all smiles having been switched over  to Qvar 80 two puffs b.i.d. and Spiriva 1 daily. The feeling was that he  had probably significant asthma with symptoms that may have been  aggravated by the use of DPI.   He says he can do anything he wants to and the only problem he is having  recently is increased cough and dyspnea with the onset of the pollen  season. Despite this, he says he rarely needs the albuterol more than  twice daily and uses Claritin with good control of the upper airway  symptoms.   PHYSICAL EXAMINATION:  GENERAL:  He is a pleasant, ambulatory, white  male in no acute distress.  VITAL SIGNS:  He had stable vital signs.  HEENT:  Reveals mild nonspecific turbinate edema. Oropharynx clear.  Dentition intact.  NECK:  Supple without cervical adenopathy or tenderness. The trachea is  midline, no thyromegaly.  LUNGS:  Lung fields revealed diminished breath sounds bilaterally with  no wheezing. There is moderate increase in expiratory time.  HEART:  He has a regular rate and rhythm without murmur, gallop or rub.  ABDOMEN:  Soft and benign.  EXTREMITIES:  Warm without calf tenderness, cyanosis, clubbing or edema.   PFTs show improvement in FEV1 up to 2.32 now, the best he has ever done,  with no further improvement after bronchodilators.   IMPRESSION:  1. Chronic obstructive pulmonary disease with an asthmatic component      that is being adequately addressed on Qvar. The chronic obstructive      pulmonary disease fixed component is being adequately controlled      on Spiriva.  2. Note that despite the fat that  he is having what would  otherwise      be a typical trigger for asthma (upper airway complaints) he is      actually doing quite well with minimum need for rescue therapy.   I did advise him to continue to use albuterol p.r.n. and Claritin as  well p.r.n. and underwent circumstances to call back for followup (the  rule of 2s was reviewed), otherwise will see him back here in 6 months.   Part of the office visit today was spent teaching him optimal MDI  technique which he mastered to 75 plus percent effectiveness. He is  limited by inspiratory capacity from taking as deep an inspiration as I  would otherwise prefer but generally mastered it very well.     Charlaine Dalton. Sherene Sires, MD, [facility]  Electronically Signed    MBW/MedQ  DD: 03/30/2007  DT: 03/31/2007  Job #: [id number]   cc:   Jonita Albee, M.D.

## 2011-05-22 ENCOUNTER — Other Ambulatory Visit: Payer: Self-pay | Admitting: Internal Medicine

## 2011-06-10 ENCOUNTER — Other Ambulatory Visit: Payer: Self-pay | Admitting: Internal Medicine

## 2011-11-27 ENCOUNTER — Other Ambulatory Visit: Payer: Self-pay | Admitting: Internal Medicine

## 2011-11-28 ENCOUNTER — Other Ambulatory Visit: Payer: Self-pay | Admitting: Internal Medicine

## 2011-12-16 ENCOUNTER — Other Ambulatory Visit: Payer: Self-pay | Admitting: Internal Medicine

## 2011-12-30 ENCOUNTER — Encounter: Payer: Self-pay | Admitting: Internal Medicine

## 2011-12-31 ENCOUNTER — Ambulatory Visit (INDEPENDENT_AMBULATORY_CARE_PROVIDER_SITE_OTHER): Payer: Managed Care, Other (non HMO) | Admitting: Internal Medicine

## 2011-12-31 ENCOUNTER — Encounter: Payer: Self-pay | Admitting: Internal Medicine

## 2011-12-31 VITALS — BP 110/62 | HR 95 | Temp 98.0°F | Ht 67.75 in | Wt 196.0 lb

## 2011-12-31 DIAGNOSIS — J449 Chronic obstructive pulmonary disease, unspecified: Secondary | ICD-10-CM

## 2011-12-31 MED ORDER — BECLOMETHASONE DIPROPIONATE 80 MCG/ACT IN AERS: 2.0000 | INHALATION_SPRAY | Freq: Two times a day (BID) | RESPIRATORY_TRACT | Status: DC

## 2011-12-31 MED ORDER — ALBUTEROL SULFATE HFA 108 (90 BASE) MCG/ACT IN AERS: 2.0000 | INHALATION_SPRAY | RESPIRATORY_TRACT | Status: DC | PRN

## 2011-12-31 NOTE — Patient Instructions (Signed)
If your breathing worsens or you need to use your rescue inhaler more than twice weekly or wake up more than twice a month with any respiratory symptoms or require more than two rescue inhalers per year, we need to see you right away because this means we're not controlling the underlying problem (inflammation) adequately.  Rescue inhalers do not control inflammation and overuse can lead to unnecessary and costly consequences.  They can make you feel better temporarily but eventually they will quit working effectively much as sleep aids lead to more insomnia if used regularly.   Return in one year for full pft's and cxr if not done in meantime

## 2011-12-31 NOTE — Progress Notes (Signed)
Subjective:     Patient ID: Jared [name], male   DOB: 1956/06/02  MRN: [id number]  HPI  55yowm quit smoking 2003 with COPD PFTs 4/08 FEV1 67% ratio 53% diffusing capacity 98% improved but has plateaued at present doe heavy exertion does ok in yard , walks but no jogging but can do treadmill.   March 07, 2009 ov no longer needing rescue rx but required round prednisone and abx 2/10 he attributed to cold damp weather.   September 04, 2009 ov no need for rescue, able to ex aerobically x 30-40 min   February 13, 2010 Acute visit. Pt c/o "head cold" x 4 days. He c/o prod cough with yellow sputum and chest congestion with sore throat.   February 27, 2010--Presents for an acute office visit. Seen on 3/8 for URI-tx w/ doxcycline and steroid taper. NO better, 3/14 called in levaquin 750x5d, /steroids-some better today but only minimally improved, still running fever-99.8 , cough w/ clear to yellow mucus, wheezing. Denies chest pain, dyspnea, orthopnea, hemoptysis, n/v/d. Cough is getting better. Most concerned why he is not completely better and running low grade fevers. Has jury duty tomorrow.   July 10, 2010 Acute visit. Pt c/o persistant low grade fever since 07/01/10- noticed fever mainly in the afternoon. He also has had slight increase in SOB and some prod cough in the am with minimal yellow sputum. no sinus c/o's no reflux, skin rash. has not used rescue rx over baseline> w/u by Orvan Falconer from [facility] was negative.   12/31/2011 f/u ov/Jared [name] still on qvar 80 2 bid and prn proaire and maintained spiriva cc sob if over eats and variable doe x fast walk flat, walks up hills fine, no sign cough.  Sleeping ok without nocturnal  or early am exacerbation  of respiratory  c/o's or need for noct saba. Also denies any obvious fluctuation of symptoms with weather or environmental changes or other aggravating or alleviating factors except as outlined above   ROS  At present neg for  any significant sore throat,  dysphagia, itching, sneezing,  nasal congestion or excess/ purulent secretions,  fever, chills, sweats, unintended wt loss, pleuritic or exertional cp, hempoptysis, orthopnea pnd or leg swelling.  Also denies presyncope, palpitations, heartburn, abdominal pain, nausea, vomiting, diarrhea  or change in bowel or urinary habits, dysuria,hematuria,  rash, arthralgias, visual complaints, headache, numbness weakness or ataxia.         Allergies  1) ! Penicillin  2) ! Wellbutrin    Past Medical History:  COPD (ICD-496)  - PFT's 03/2007 FEV 67% with ratio of 53 and DLC0 98%  - HFA 75% March 07, 2009 > 100 % September 04, 2009 > confirmed February 13, 2010  RHINITIS    Review of Systems     Objective:   Physical Exam  Pleasant ambulatory white male in no acute distress  wt 187 March 07, 2009  >>182 February 27, 2010 > 184 July 10, 2010 > 12/31/2011  196  HEENT mild turbinate edema. Oropharynx no thrush or excess pnd or cobblestoning. No JVD or cervical adenopathy. Mild accessory muscle hypertrophy. Trachea midline, nl thryroid. Chest was hyperinflated by percussion with diminished breath sounds and moderate increased exp time with no exp wheeze. Hoover sign positive at early inspiration. Regular rate and rhythm without murmur gallop or rub or increase P2 no edema. Abd: no hsm, nl excursion. Ext warm without cyanosis or clubbing  cxr rec 12/31/2011 > decllined    Assessment:  Plan:

## 2011-12-31 NOTE — Assessment & Plan Note (Signed)
Unusual combination of copd with an asthmatic component controlled on qvar with minimal need for saba while on spiriva and doing great with rare flares ? Reflux related or related to uri's, the former more freq than the latter  For now rec wt loss, GERD diet  All goals of chronic asthma control met including optimal function and elimination of symptoms with minimal need for rescue therapy.  Contingencies discussed in full including contacting this office immediately if not controlling the symptoms using the rule of two's.

## 2012-02-03 ENCOUNTER — Other Ambulatory Visit: Payer: Self-pay | Admitting: Internal Medicine

## 2012-12-16 ENCOUNTER — Telehealth: Payer: Self-pay | Admitting: Internal Medicine

## 2012-12-16 NOTE — Telephone Encounter (Signed)
lmomtcb x1 

## 2012-12-16 NOTE — Telephone Encounter (Signed)
Pt's wife returned call.  She stated that pt's job has moved to downtown [employer] - takes him approx 12 mins to walk to the building and this is difficult for him, especially d/t the cold weather.  Wife denies any changes with pt's breathing otherwise.  Pt's wife aware MW not in office this afternoon and is okay with a call back tomorrow.  Dr Sherene Sires please advise, thanks.  Pt last seen 1.22.13, follow up in 1 yr w/ cxr and PFT.  No appts pending.

## 2012-12-16 NOTE — Telephone Encounter (Signed)
Needs f/u appt - ok to give placard in meantime

## 2012-12-16 NOTE — Telephone Encounter (Signed)
I spoke with spouse and is aware. She would like a call when this is ready for p/u. Will forward to [place].

## 2012-12-17 NOTE — Telephone Encounter (Signed)
Form filled out and signed by MW, left up front for pick up [facility]- pt still needs to make an appt

## 2012-12-17 NOTE — Telephone Encounter (Signed)
Pt's spouse returned call. She will pick up form tomorrow and then schedule f/u appt after checking with pt (schedule). Jared [name]

## 2012-12-30 ENCOUNTER — Other Ambulatory Visit: Payer: Self-pay | Admitting: Internal Medicine

## 2012-12-31 ENCOUNTER — Encounter: Payer: Self-pay | Admitting: Internal Medicine

## 2012-12-31 ENCOUNTER — Ambulatory Visit (INDEPENDENT_AMBULATORY_CARE_PROVIDER_SITE_OTHER): Payer: Managed Care, Other (non HMO) | Admitting: Internal Medicine

## 2012-12-31 VITALS — BP 130/86 | HR 102 | Temp 98.7°F | Ht 67.75 in | Wt 193.0 lb

## 2012-12-31 DIAGNOSIS — J449 Chronic obstructive pulmonary disease, unspecified: Secondary | ICD-10-CM

## 2012-12-31 MED ORDER — ALBUTEROL SULFATE HFA 108 (90 BASE) MCG/ACT IN AERS: 2.0000 | INHALATION_SPRAY | RESPIRATORY_TRACT | Status: DC | PRN

## 2012-12-31 NOTE — Progress Notes (Signed)
Subjective:     Patient ID: Jared [name], male   DOB: 10-05-1956  MRN: [id number]  HPI  88 yowm quit smoking 2003 with COPD PFTs 4/08 FEV1 67% ratio 53% diffusing capacity 98% improved but has plateaued at present doe heavy exertion does ok in yard , walks but no jogging but can do treadmill.   March 07, 2009 ov no longer needing rescue rx but required round prednisone and abx 2/10 he attributed to cold damp weather.   September 04, 2009 ov no need for rescue, able to ex aerobically x 30-40 min   February 13, 2010 Acute visit. Pt c/o "head cold" x 4 days. He c/o prod cough with yellow sputum and chest congestion with sore throat.   February 27, 2010--Presents for an acute office visit. Seen on 3/8 for URI-tx w/ doxcycline and steroid taper. NO better, 3/14 called in levaquin 750x5d, /steroids-some better today but only minimally improved, still running fever-99.8 , cough w/ clear to yellow mucus, wheezing. Denies chest pain, dyspnea, orthopnea, hemoptysis, n/v/d. Cough is getting better. Most concerned why he is not completely better and running low grade fevers. Has jury duty tomorrow.   July 10, 2010 Acute visit. Pt c/o persistant low grade fever since 07/01/10- noticed fever mainly in the afternoon. He also has had slight increase in SOB and some prod cough in the am with minimal yellow sputum. no sinus c/o's no reflux, skin rash. has not used rescue rx over baseline> w/u by Orvan Falconer from [facility] was negative.   12/31/2011 f/u ov/Kaydance Bowie still on qvar 80 2 bid and prn proaire and maintained spiriva cc sob if over eats and variable doe x fast walk flat, walks up hills fine, no sign cough. Rule of two's, no change rx   12/31/2012 f/u ov/Ladrea Holladay cc worse sob with cold weather, couldn't tolerate laba previously but very good with qvar and spiriva adherence.    No obvious daytime variabilty or assoc chronic cough or cp or chest tightness, subjective wheeze overt sinus or hb symptoms. No unusual exp hx    Sleeping ok without nocturnal  or early am exacerbation  of respiratory  c/o's or need for noct saba. Also denies any obvious fluctuation of symptoms with weather or environmental changes or other aggravating or alleviating factors except as outlined above   ROS  The following are not active complaints unless bolded sore throat, dysphagia, dental problems, itching, sneezing,  nasal congestion or excess/ purulent secretions, ear ache,   fever, chills, sweats, unintended wt loss, pleuritic or exertional cp, hemoptysis,  orthopnea pnd or leg swelling, presyncope, palpitations, heartburn, abdominal pain, anorexia, nausea, vomiting, diarrhea  or change in bowel or urinary habits, change in stools or urine, dysuria,hematuria,  rash, arthralgias, visual complaints, headache, numbness weakness or ataxia or problems with walking or coordination,  change in mood/affect or memory.             Allergies  1) ! Penicillin  2) ! Wellbutrin    Past Medical History:  COPD (ICD-496)  - PFT's 03/2007 FEV 67% with ratio of 53 and DLC0 98%  - HFA 75% March 07, 2009 > 100 % September 04, 2009 > confirmed February 13, 2010  RHINITIS          Objective:   Physical Exam  Pleasant ambulatory white male in no acute distress  wt 187 March 07, 2009  >>182 February 27, 2010 > 184 July 10, 2010 > 12/31/2011  196 > 193 12/31/2012  HEENT  mild turbinate edema. Oropharynx no thrush or excess pnd or cobblestoning. No JVD or cervical adenopathy. Mild accessory muscle hypertrophy. Trachea midline, nl thryroid. Chest was hyperinflated by percussion with diminished breath sounds and moderate increased exp time with no exp wheeze. Hoover sign positive at early inspiration. Regular rate and rhythm without murmur gallop or rub or increase P2 no edema. Abd: no hsm, nl excursion. Ext warm without cyanosis or clubbing  cxr rec 12/31/2011 > decllined    Assessment:         Plan:

## 2012-12-31 NOTE — Patient Instructions (Addendum)
Ok to use your albuterol up to twice a daily as long as you continue your qvar and spiriva.  Please schedule a follow up visit in 3 months but call sooner if needed with pft's and cxr

## 2013-01-01 NOTE — Assessment & Plan Note (Addendum)
-   PFT's 03/2007 FEV 67% with ratio of 53 and DLC0 98%  - HFA 100% 12/31/2011   Needs alpha one AT phenotype from old records, requested    Each maintenance medication was reviewed in detail including most importantly the difference between maintenance and as needed and under what circumstances the prns are to be used.  Please see instructions for details which were reviewed in writing and the patient given a copy.    He could be using saba up to twice daily if limited by sob.  Needs new set of pft's

## 2013-01-18 ENCOUNTER — Encounter: Payer: Self-pay | Admitting: Internal Medicine

## 2013-01-29 ENCOUNTER — Other Ambulatory Visit: Payer: Self-pay | Admitting: Internal Medicine

## 2013-03-23 ENCOUNTER — Encounter: Payer: Self-pay | Admitting: Internal Medicine

## 2013-03-23 ENCOUNTER — Ambulatory Visit (INDEPENDENT_AMBULATORY_CARE_PROVIDER_SITE_OTHER)
Admission: RE | Admit: 2013-03-23 | Discharge: 2013-03-23 | Disposition: A | Discharge status: Home / Self Care | Payer: Managed Care, Other (non HMO) | Source: Ambulatory Visit | Attending: Internal Medicine | Admitting: Internal Medicine

## 2013-03-23 ENCOUNTER — Ambulatory Visit (INDEPENDENT_AMBULATORY_CARE_PROVIDER_SITE_OTHER): Payer: Managed Care, Other (non HMO) | Admitting: Internal Medicine

## 2013-03-23 ENCOUNTER — Other Ambulatory Visit: Payer: Managed Care, Other (non HMO)

## 2013-03-23 VITALS — BP 128/78 | HR 125 | Temp 100.4°F | Ht 68.0 in | Wt 192.0 lb

## 2013-03-23 DIAGNOSIS — J441 Chronic obstructive pulmonary disease with (acute) exacerbation: Secondary | ICD-10-CM

## 2013-03-23 DIAGNOSIS — J449 Chronic obstructive pulmonary disease, unspecified: Secondary | ICD-10-CM

## 2013-03-23 DIAGNOSIS — J439 Emphysema, unspecified: Secondary | ICD-10-CM

## 2013-03-23 DIAGNOSIS — J438 Other emphysema: Secondary | ICD-10-CM

## 2013-03-23 LAB — PULMONARY FUNCTION TEST

## 2013-03-23 MED ORDER — MONTELUKAST SODIUM 10 MG PO TABS: ORAL_TABLET | ORAL | Status: DC

## 2013-03-23 NOTE — Patient Instructions (Addendum)
Add singulair 10 mg one each pm for the next month to see if activity tolerance improves or need for albuterol declines if not stop the singulair and use the albuterol more aggressively, especially before exertion.  Please schedule a follow up visit in 6 months but call sooner if needed.

## 2013-03-23 NOTE — Progress Notes (Signed)
Subjective:     Patient ID: Jared [name], male   DOB: February 17, 1956  MRN: [id number]  HPI  78 yowm quit smoking 2003 with COPD PFTs 4/08 FEV1 67% ratio 53% diffusing capacity 98% improved but has plateaued at present doe heavy exertion does ok in yard , walks but no jogging but can do treadmill.   March 07, 2009 ov no longer needing rescue rx but required round prednisone and abx 2/10 he attributed to cold damp weather.   September 04, 2009 ov no need for rescue, able to ex aerobically x 30-40 min   February 13, 2010 Acute visit. Pt c/o "head cold" x 4 days. He c/o prod cough with yellow sputum and chest congestion with sore throat.   February 27, 2010--Presents for an acute office visit. Seen on 3/8 for URI-tx w/ doxcycline and steroid taper. NO better, 3/14 called in levaquin 750x5d, /steroids-some better today but only minimally improved, still running fever-99.8 , cough w/ clear to yellow mucus, wheezing. Denies chest pain, dyspnea, orthopnea, hemoptysis, n/v/d. Cough is getting better. Most concerned why he is not completely better and running low grade fevers. Has jury duty tomorrow.   July 10, 2010 Acute visit. Pt c/o persistant low grade fever since 07/01/10- noticed fever mainly in the afternoon. He also has had slight increase in SOB and some prod cough in the am with minimal yellow sputum. no sinus c/o's no reflux, skin rash. has not used rescue rx over baseline> w/u by Orvan Falconer from [facility] was negative.   12/31/2011 f/u ov/Wert still on qvar 80 2 bid and prn proaire and maintained spiriva cc sob if over eats and variable doe x fast walk flat, walks up hills fine, no sign cough. Rule of two's, no change rx   12/31/2012 f/u ov/Wert cc worse sob with cold weather, couldn't tolerate laba previously but very good with qvar and spiriva adherence.   rec Ok to use your albuterol up to twice a daily as long as you continue your qvar and spiriva.   03/23/2013 f/u ov/Wert re copd/ab Chief Complaint   Patient presents with  . COPD    breathing unchanged.   using albuterol avg once a day, rarely exercises but does fine albtuerol helps when he uses it, no cramps from albuterol.  No obvious daytime variabilty or assoc chronic cough or cp or chest tightness, subjective wheeze overt sinus or hb symptoms. No unusual exp hx   Sleeping ok without nocturnal  or early am exacerbation  of respiratory  c/o's or need for noct saba. Also denies any obvious fluctuation of symptoms with weather or environmental changes or other aggravating or alleviating factors except as outlined above   ROS  The following are not active complaints unless bolded sore throat, dysphagia, dental problems, itching, sneezing,  nasal congestion or excess/ purulent secretions, ear ache,   fever, chills, sweats, unintended wt loss, pleuritic or exertional cp, hemoptysis,  orthopnea pnd or leg swelling, presyncope, palpitations, heartburn, abdominal pain, anorexia, nausea, vomiting, diarrhea  or change in bowel or urinary habits, change in stools or urine, dysuria,hematuria,  rash, arthralgias, visual complaints, headache, numbness weakness or ataxia or problems with walking or coordination,  change in mood/affect or memory.             Allergies  1) ! Penicillin  2) ! Wellbutrin    Past Medical History:  COPD (ICD-496)  - PFT's 03/2007 FEV 67% with ratio of 53 and DLC0 98%  - HFA 75%  March 07, 2009 > 100 % September 04, 2009 > confirmed February 13, 2010  RHINITIS          Objective:   Physical Exam  Pleasant ambulatory white male in no acute distress  wt 187 March 07, 2009  >>182 February 27, 2010 > 184 July 10, 2010 > 12/31/2011  196 > 193 12/31/2012 > 192 03/23/2013  HEENT mild turbinate edema. Oropharynx no thrush or excess pnd or cobblestoning. No JVD or cervical adenopathy. Mild accessory muscle hypertrophy. Trachea midline, nl thryroid. Chest was hyperinflated by percussion with diminished breath sounds and moderate  increased exp time with no exp wheeze. Hoover sign positive at early inspiration. Regular rate and rhythm without murmur gallop or rub or increase P2 no edema. Abd: no hsm, nl excursion. Ext warm without cyanosis or clubbing  CXR  03/23/2013 : Underlying emphysematous change, stable. No edema or  consolidation.       Assessment:         Plan:

## 2013-03-23 NOTE — Progress Notes (Signed)
PFT done today. 

## 2013-03-24 NOTE — Assessment & Plan Note (Addendum)
-   alpha One AT level 137  01/25/03> genotype ordered 03/23/13  - PFT's 03/2007 FEV 67% with ratio of 53 and DLC0 98%  - PFT's 03/23/2013  FEV 1.25 (38%) ratio of 40 and 34% p B2 and DLCO 97% - HFA 100% 12/31/2011   He has had a decline in fev1 but a large reversible component. He has significant rhinitis symptoms as well so might benefit like an asthmatic from addition of singulair > started   Unfortunately he is very sensitive to LABA's to date. One option might be the new LABA/LAMA combination on a trial basis is looses any ground in term of activity tol over next 6 months

## 2013-03-25 NOTE — Progress Notes (Signed)
Quick Note:  Spoke with pt and notified of results per Dr. Wert. Pt verbalized understanding and denied any questions.  ______ 

## 2013-03-30 LAB — ALPHA-1 ANTITRYPSIN PHENOTYPE: A-1 Antitrypsin: 117 mg/dL (ref 83–199)

## 2013-03-31 ENCOUNTER — Encounter: Payer: Self-pay | Admitting: Internal Medicine

## 2013-04-01 NOTE — Progress Notes (Signed)
Quick Note:  lmomtcb for pt ______ 

## 2013-04-02 ENCOUNTER — Telehealth: Payer: Self-pay | Admitting: Internal Medicine

## 2013-04-02 NOTE — Telephone Encounter (Signed)
No msg needed. Pt was returning call from nurse re: results- Nurse spoke to pt. Jared [name]

## 2013-04-02 NOTE — Progress Notes (Signed)
Quick Note:  Spoke with pt and notified of results per Dr. Wert. Pt verbalized understanding and denied any questions.  ______ 

## 2013-04-09 ENCOUNTER — Encounter: Payer: Self-pay | Admitting: Internal Medicine

## 2013-06-14 ENCOUNTER — Ambulatory Visit (INDEPENDENT_AMBULATORY_CARE_PROVIDER_SITE_OTHER): Payer: Managed Care, Other (non HMO) | Admitting: Family Medicine

## 2013-06-14 ENCOUNTER — Ambulatory Visit: Payer: Managed Care, Other (non HMO)

## 2013-06-14 VITALS — BP 142/80 | HR 110 | Temp 98.4°F | Resp 18 | Ht 68.0 in | Wt 192.0 lb

## 2013-06-14 DIAGNOSIS — M7020 Olecranon bursitis, unspecified elbow: Secondary | ICD-10-CM

## 2013-06-14 DIAGNOSIS — M7021 Olecranon bursitis, right elbow: Secondary | ICD-10-CM

## 2013-06-14 DIAGNOSIS — M25529 Pain in unspecified elbow: Secondary | ICD-10-CM

## 2013-06-14 DIAGNOSIS — M25522 Pain in left elbow: Secondary | ICD-10-CM

## 2013-06-14 DIAGNOSIS — L84 Corns and callosities: Secondary | ICD-10-CM

## 2013-06-14 DIAGNOSIS — R509 Fever, unspecified: Secondary | ICD-10-CM

## 2013-06-14 LAB — POCT CBC
Granulocyte percent: 75.3 % (ref 37–80)
HCT, POC: 48.3 % (ref 43.5–53.7)
Hemoglobin: 15.8 g/dL (ref 14.1–18.1)
Lymph, poc: 1.6 (ref 0.6–3.4)
MCH, POC: 35.1 pg — AB (ref 27–31.2)
MCHC: 32.7 g/dL (ref 31.8–35.4)
MCV: 107.4 fL — AB (ref 80–97)
MID (cbc): 0.5 (ref 0–0.9)
MPV: 8.4 fL (ref 0–99.8)
POC Granulocyte: 6.4 (ref 2–6.9)
POC LYMPH PERCENT: 18.5 % (ref 10–50)
POC MID %: 6.2 %M (ref 0–12)
Platelet Count, POC: 252 10*3/uL (ref 142–424)
RBC: 4.5 M/uL — AB (ref 4.69–6.13)
RDW, POC: 13.8 %
WBC: 8.5 10*3/uL (ref 4.6–10.2)

## 2013-06-14 NOTE — Progress Notes (Signed)
[facility]:  Office Visit  Chief Complaint:  Chief Complaint  Patient presents with  . Fever    x 5 days   . Joint Swelling    Left elbow x 2-3 weeks   . Cough    HPI: Jared [name] is a 57 y.o. male who complains of  Low grade fever  X 1 week T max 100.2, He has had this before, it appears that he has had a full workup for this. Patient also has chronic bronchitis, he is a COPDer. No productive sputum, no ear pain, no throat pain. 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  He denies numbness/tingling He has pain on the outside of his picnky toe  Past Medical History  Diagnosis Date  . COPD (chronic obstructive pulmonary disease)   . Rhinitis    History reviewed. No pertinent past surgical history. History   Social History  . Marital Status: Married    Spouse Name: N/A    Number of Children: N/A  . Years of Education: N/A   Social History Main Topics  . Smoking status: Former Smoker -- 2.00 packs/day for 20 years    Types: Cigarettes    Quit date: 11/08/2002  . Smokeless tobacco: Never Used  . Alcohol Use: No  . Drug Use: No  . Sexually Active: None   Other Topics Concern  . None   Social History Narrative  . None   Family History  Problem Relation Age of Onset  . Emphysema Mother     smoked  . Lung cancer Mother     smoked   Allergies  Allergen Reactions  . Aspirin   . Bupropion Hcl   . Penicillins    Prior to Admission medications   Medication Sig Start Date End Date Taking? Authorizing Provider  albuterol (PROAIR HFA) 108 (90 BASE) MCG/ACT inhaler Inhale 2 puffs into the lungs every 4 (four) hours as needed for wheezing. 12/31/12  Yes Nyoka Cowden, MD  montelukast (SINGULAIR) 10 MG tablet One at bedtime every night 03/23/13  Yes Nyoka Cowden, MD  QVAR 80 MCG/ACT inhaler inhale 2 puffs INTO THE LUNGS TWICE A DAY 12/30/12  Yes Nyoka Cowden, MD  SPIRIVA HANDIHALER 18 MCG inhalation capsule inhale the contents of one capsule in the handihaler once daily 01/29/13  Yes Nyoka Cowden, MD     ROS: The patient denies night sweats, unintentional weight loss, chest pain, palpitations, wheezing, dyspnea on exertion, nausea, vomiting, abdominal pain, dysuria, hematuria, melena, numbness, weakness, or tingling.   All other systems have been reviewed and were otherwise negative with the exception of those mentioned in the HPI and as above.    PHYSICAL EXAM: Filed Vitals:   06/14/13 1205  BP: 142/80  Pulse: 110  Temp: 98.4 F (36.9 C)  Resp: 18   Filed Vitals:   06/14/13 1205  Height: 5\' 8"  (1.727 m)  Weight: 192 lb (87.091 kg)   Body mass index is 29.2 kg/(m^2).  General: Alert, no acute distress HEENT:  Normocephalic, atraumatic, oropharynx patent. Tm nl. No exudates, + PND, no sinus tenderness Cardiovascular:  Regular rate and rhythm, no rubs murmurs or gallops.  No Carotid bruits, radial pulse intact. No pedal edema.  Respiratory: Clear to auscultation bilaterally.  No wheezes, rales, or rhonchi.  No cyanosis, no use of accessory musculature GI: No organomegaly, abdomen is soft and non-tender, positive bowel sounds.  No masses. Skin: No rashes. Neurologic: Facial musculature symmetric. Psychiatric: Patient is appropriate throughout our interaction. Lymphatic: No cervical lymphadenopathy Musculoskeletal: Gait intact. Left elbow-slightly warmer than left, full ROM, 5/5 strength, minimal-no swelling. No redness, 2/2 DTRs Left foot-pinky toe callus, ? Plantar wart  LABS: Results for orders placed in visit on 06/14/13  POCT CBC      Result Value Range   WBC 8.5  4.6 - 10.2 K/uL   Lymph, poc 1.6  0.6 - 3.4   POC LYMPH PERCENT 18.5  10 - 50 %L   MID (cbc) 0.5  0 - 0.9   POC MID % 6.2  0 - 12 %M   POC Granulocyte 6.4  2  - 6.9   Granulocyte percent 75.3  37 - 80 %G   RBC 4.50 (*) 4.69 - 6.13 M/uL   Hemoglobin 15.8  14.1 - 18.1 g/dL   HCT, POC 16.1  09.6 - 53.7 %   MCV 107.4 (*) 80 - 97 fL   MCH, POC 35.1 (*) 27 - 31.2 pg   MCHC 32.7  31.8 - 35.4 g/dL   RDW, POC 04.5     Platelet Count, POC 252  142 - 424 K/uL   MPV 8.4  0 - 99.8 fL     EKG/XRAY:   Primary read interpreted by Dr. Conley Rolls at [facility]. No dislocation/fracture   ASSESSMENT/PLAN: Encounter Diagnoses  Name Primary?  . Fever, unspecified Yes  . Elbow pain, left   . Olecranon bursitis, right   . Callus of foot    Fever has been worked up before, he is afebirle here. He may have PND/chronic cough sxs from h/o smoking. Continue to monitor and take COPD meds and allergy meds OTC advil 600 mg q8hrs prn for possible olecranon bursitis ROM exercises Verbal consent obtained for callus shave , pt agreed, no problems.  Attempted to do foot shave with good results, he may use otc plantar wart salycyclic acid bandaids if tolerate advise to him that it is ASA. There may be underlyingplantar wart underneath callus but we did not have time to do cryo in addition to everything else he wanted me to address.      Reign Dziuba PHUONG, DO 06/14/2013 1:49 PM

## 2013-06-14 NOTE — Patient Instructions (Addendum)
Olecranon Bursitis  °with Rehab °A bursa is a fluid filled sac that is located between soft tissues (ligaments, tendons, skin) and bones. The purpose of a bursa is to allow the soft tissue to function smoothly, without friction. The olecrenon bursa is located between the back of the elbow (olecrenon) and the skin. Olecrenon bursitis involves inflammation of this bursa, resulting in pain. °SYMPTOMS  °· Pain, tenderness, swelling, warmth, or redness over the back of the elbow. °· Reduced range of motion of the affected elbow. °· Sometimes, severe pain with movement of the affected elbow. °· Crackling sound (crepitation) when the bursa is moved or touched. °· Often, painless swelling of the bursa. °· Fever (when infected). °CAUSES  °Olecranon bursitis is often caused by direct hit (trauma) to the elbow. Less commonly, it is due to overuse and/or strenuous exercise that the elbow is not used to. °RISK INCREASES WITH: °· Sports that require bending or landing on the elbow (football, volleyball). °· Vigorous or repetitive athletic training, or sudden increase or change in activity level (weekend warriors). °· Failure to warm up properly before activity. °· Poor exercise technique. °· Playing on artificial turf. °PREVENTION °· Avoid injuries and the overuse of muscles whenever possible. °· Warm up and stretch properly before activity. °· Allow for adequate recovery between workouts. °· Maintain physical fitness: °· Strength, flexibility, and endurance. °· Cardiovascular fitness. °· Learn and use proper technique. °· Wear properly fitted and padded protective equipment. °PROGNOSIS  °If treated properly, olecranon bursitis is usually curable within 2 weeks.  °RELATED COMPLICATIONS  °· Longer healing time, if not properly treated or if not given enough time to heal. °· Recurring symptoms that result in a chronic problem. °· Joint stiffness with permanent limitation of the affected joint's movement. °· Infection of the  bursa. °· Chronic inflammation or scarring of the bursa. °TREATMENT °Treatment first involves the use of ice and medicine, to reduce pain and inflammation. The use of strengthening and stretching exercises may help reduce pain with activity. These exercises may be performed at home or with a therapist. Elbow pads may be advised, to protect the bursa. If symptoms persist, despite non-surgical treatment, a procedure to withdraw fluid from the bursa may be advised. This procedure may be accompanied with an injection of corticosteroids, to reduce inflammation. Sometimes, surgery is needed to remove the bursa. °MEDICATION °· If pain medicine is needed, nonsteroidal anti-inflammatory medicines (aspirin and ibuprofen), or other minor pain relievers (acetaminophen), are often advised. °· Do not take pain medicine for 7 days before surgery. °· Prescription pain relievers may be given, if your caregiver thinks they are needed. Use only as directed and only as much as you need. °· Corticosteroid injections may be given by your caregiver. These injections should be reserved for the most serious cases, because they may only be given a certain number of times. °HEAT AND COLD °· Cold treatment (icing) should be applied for 10 to 15 minutes every 2 to 3 hours for inflammation and pain, and immediately after activity that aggravates your symptoms. Use ice packs or an ice massage. °· Heat treatment may be used before performing stretching and strengthening activities prescribed by your caregiver, physical therapist, or athletic trainer. Use a heat pack or a warm water soak. °SEEK IMMEDIATE MEDICAL CARE IF:  °· Symptoms get worse or do not improve in 2 weeks, despite treatment. °· Signs of infection develop, including fever of 102° F (38.9° C), increased pain, redness, warmth, or pus draining from   the bursa. °· New, unexplained symptoms develop. (Drugs used in treatment may produce side effects.) °EXERCISES  °RANGE OF MOTION (ROM) AND  STRETCHING EXERCISES - Olecranon Bursitis °These exercises may help you when beginning to rehabilitate your injury. Your symptoms may resolve with or without further involvement from your physician, physical therapist or athletic trainer. While completing these exercises, remember:  °· Restoring tissue flexibility helps normal motion to return to the joints. This allows healthier, less painful movement and activity. °· An effective stretch should be held for at least 30 seconds. °· A stretch should never be painful. You should only feel a gentle lengthening or release in the stretched tissue. °RANGE OF MOTION  Elbow Flexion, Supine °· Lie on your back. Extend your right / left arm into the air, bracing it with your opposite hand. Allow your right / left arm to relax. °· Let your elbow bend, allowing your hand to fall slowly toward your chest. °· You should feel a gentle stretch along the back of your upper arm and elbow. Your physician, physical therapist or athletic trainer may ask you to hold a __________ hand weight to increase the intensity of this stretch. °· Hold for __________ seconds. Slowly return your right / left arm to the upright position. °Repeat __________ times. Complete this exercise __________ times per day. °STRETCH  Elbow Flexors °· Lie on a firm bed or countertop on your back. Be sure that you are in a comfortable position which will allow you to relax your arm muscles. °· Place a folded towel under your right / left upper arm, so that your elbow and shoulder are at the same height. Extend your arm; your elbow should not rest on the bed or towel °· Allow the weight of your hand to straighten your elbow. Keep your arm and chest muscles relaxed. Your caregiver may ask you to increase the intensity of your stretch by adding a small wrist or hand weight. °· Hold for __________ seconds. You should feel a stretch on the inside of your elbow. Slowly return to the starting position. °Repeat __________  times. Complete this exercise __________ times per day. °STRENGTHENING EXERCISES - Olecranon Bursitis °These exercises will help you regain your strength. They may resolve your symptoms with or without further involvement from your physician, physical therapist or athletic trainer. While completing these exercises, remember:  °· Muscles can gain both the endurance and the strength needed for everyday activities through controlled exercises. °· Complete these exercises as instructed by your physician, physical therapist or athletic trainer. Increase the resistance and repetitions only as guided by your caregiver. °· You may experience muscle soreness or fatigue, but the pain or discomfort you are trying to eliminate should never worsen during these exercises. If this pain does worsen, stop and make certain you are following the directions exactly. If the pain is still present after adjustments, discontinue the exercise until you can discuss the trouble with your caregiver. °STRENGTH - Elbow Extensors, Isometric °· Stand or sit upright on a firm surface. Place your right / left arm so that your palm faces your stomach, and it is at the height of your waist. °· Place your opposite hand on the underside of your forearm. Gently push up as your right / left arm resists. Push as hard as you can with both arms without causing any pain or movement at your right / left elbow. Hold this stationary position for __________ seconds. °· Gradually release the tension in both arms. Allow   your muscles to relax completely before repeating. °Repeat __________ times. Complete this exercise __________ times per day. °STRENGTH - Elbow Flexors, Isometric °· Stand or sit upright on a firm surface. Place your right / left arm so that your hand is palm-up and at the height of your waist. °· Place your opposite hand on top of your forearm. Gently push down as your right / left arm resists. Push as hard as you can with both arms without causing  any pain or movement at your right / left elbow. Hold this stationary position for __________ seconds. °· Gradually release the tension in both arms. Allow your muscles to relax completely before repeating. °Repeat __________ times. Complete this exercise __________ times per day. °STRENGTH  Elbow Flexors, Supinated °· With good posture, stand or sit on a firm chair without armrests. Allow your right / left arm to rest at your side with your palm facing forward. °· Holding a __________ weight, or gripping a rubber exercise band or tubing, °· bring your hand toward your shoulder. °· Allow your muscles to control the resistance as your hand returns to your side. °Repeat __________ times. Complete this exercise __________ times per day.  °STRENGTH  Elbow Flexors, Neutral °· With good posture, stand or sit on a firm chair without armrests. Allow your right / left arm to rest at your side with your thumb facing forward. °· Holding a __________weight, or gripping a rubber exercise band or tubing, °· bring your hand toward your shoulder. °· Allow your muscles to control the resistance as your hand returns to your side. °Repeat __________ times. Complete this exercise __________ times per day.  °STRENGTH  Elbow Extensors °· Lie on your back. Extend your right / left elbow into the air, pointing it toward the ceiling. Brace your arm with your opposite hand.* °· Holding a __________ weight in your hand, slowly straighten your right / left elbow. °· Allow your muscles to control the weight as your hand returns to its starting position. °Repeat __________ times. Complete this exercise __________ times per day. °*You may also stand with your elbow overhead and pointed toward the ceiling, supported by your opposite hand. °STRENGTH - Elbow Extensors, Dynamic °· With good posture, stand, or sit on a firm chair without armrests. Keeping your upper arms at your side, bring both hands up to your right / left shoulder while gripping a  rubber exercise band or tubing. Your right / left hand should be just below the other hand. °· Straighten your right / left elbow. Hold for __________ seconds. °· Allow your muscles to control the rubber exercise band, as your hand returns to your shoulder. °Repeat __________ times. Complete this exercise __________ times per day. °Document Released: 11/25/2005 Document Revised: 02/17/2012 Document Reviewed: 03/09/2009 °[employer]® Patient Information ©2014 [employer]. ° °

## 2013-06-19 ENCOUNTER — Other Ambulatory Visit: Payer: Self-pay | Admitting: Internal Medicine

## 2013-06-22 ENCOUNTER — Other Ambulatory Visit: Payer: Self-pay | Admitting: Internal Medicine

## 2013-06-25 ENCOUNTER — Ambulatory Visit (INDEPENDENT_AMBULATORY_CARE_PROVIDER_SITE_OTHER): Payer: Managed Care, Other (non HMO) | Admitting: Emergency Medicine

## 2013-06-25 ENCOUNTER — Ambulatory Visit: Payer: Managed Care, Other (non HMO)

## 2013-06-25 VITALS — BP 110/78 | HR 97 | Temp 98.0°F | Resp 16 | Ht 67.5 in | Wt 188.0 lb

## 2013-06-25 DIAGNOSIS — R509 Fever, unspecified: Secondary | ICD-10-CM

## 2013-06-25 DIAGNOSIS — J029 Acute pharyngitis, unspecified: Secondary | ICD-10-CM

## 2013-06-25 LAB — POCT CBC
Granulocyte percent: 73.1 %G (ref 37–80)
Lymph, poc: 1.4 (ref 0.6–3.4)
MCH, POC: 34.8 pg — AB (ref 27–31.2)
MCHC: 32.1 g/dL (ref 31.8–35.4)
MCV: 108.5 fL — AB (ref 80–97)
MID (cbc): 0.4 (ref 0–0.9)
POC LYMPH PERCENT: 20.7 %L (ref 10–50)
Platelet Count, POC: 247 10*3/uL (ref 142–424)
RDW, POC: 14.2 %

## 2013-06-25 MED ORDER — NAPROXEN SODIUM 550 MG PO TABS: 550.0000 mg | ORAL_TABLET | Freq: Two times a day (BID) | ORAL | Status: DC

## 2013-06-25 NOTE — Progress Notes (Signed)
[facility] [address] [place] [address] [phone]  Date:  06/25/2013   Name:  Jared [name]   DOB:  26-Apr-1956   MRN:  [id number]  PCP:  Tally Due, MD    Chief Complaint: Follow-up and Fever   History of Present Illness:  Jared [name] is a 57 y.o. very pleasant male patient who presents with the following:  Seen over the past several years for "fever" and had normal labs.  Consultation with ID was obtained.  Has increasing COPD symptoms.  Continues to have daily fatigue, malaise and "low grade fever" up to 100 daily.  Has post nasal drainage and sore throat now that began this morning.  Nasal congestion with mucoid drainage. Cough with AM mucopurulent sputum.  Increased shortness of breath and some wheezing.  Pain in left elbow despite change in posture.  No improvement with occasional ibuprofen.  No chills, GI, GU, symptoms.  No rash.  No arthritis.  No improvement with over the counter medications or other home remedies. Denies other complaint or health concern today.   Patient Active Problem List   Diagnosis Date Noted  . FEVER UNSPECIFIED 08/11/2010  . FATIQUE AND MALAISE 07/10/2010  . RHINITIS 10/27/2007  . COPD 10/27/2007    Past Medical History  Diagnosis Date  . COPD (chronic obstructive pulmonary disease)   . Rhinitis     History reviewed. No pertinent past surgical history.  History  Substance Use Topics  . Smoking status: Former Smoker -- 2.00 packs/day for 20 years    Types: Cigarettes    Quit date: 11/08/2002  . Smokeless tobacco: Never Used  . Alcohol Use: No    Family History  Problem Relation Age of Onset  . Emphysema Mother     smoked  . Lung cancer Mother     smoked    Allergies  Allergen Reactions  . Aspirin   . Bupropion Hcl   . Penicillins     Medication list has been reviewed and updated.  Current Outpatient Prescriptions on File Prior to Visit  Medication Sig Dispense Refill  .  albuterol (PROAIR HFA) 108 (90 BASE) MCG/ACT inhaler Inhale 2 puffs into the lungs every 4 (four) hours as needed for wheezing.  8.5 g  11  . montelukast (SINGULAIR) 10 MG tablet take 1 tablet by mouth once daily at bedtime  30 tablet  11  . QVAR 80 MCG/ACT inhaler inhale 2 puffs by mouth INTO THE LUNGS twice a day  8.7 g  11  . SPIRIVA HANDIHALER 18 MCG inhalation capsule inhale the contents of one capsule in the handihaler once daily  30 capsule  11   No current facility-administered medications on file prior to visit.    Review of Systems:  As per HPI, otherwise negative.    Physical Examination: Filed Vitals:   06/25/13 0840  BP: 110/78  Pulse: 97  Temp: 98 F (36.7 C)  Resp: 16   Filed Vitals:   06/25/13 0840  Height: 5' 7.5" (1.715 m)  Weight: 188 lb (85.276 kg)   Body mass index is 28.99 kg/(m^2). Ideal Body Weight: Weight in (lb) to have BMI = 25: 161.7  GEN: WDWN, NAD, Non-toxic, A & O x 3 HEENT: Atraumatic, Normocephalic. Neck supple. No masses, No LAD. Ears and Nose: No external deformity. CV: RRR, No M/G/R. No JVD. No thrill. No extra heart sounds. PULM: CTA B, no wheezes, crackles, rhonchi. No retractions. No resp. distress.  No accessory muscle use. ABD: S, NT, ND, +BS. No rebound. No HSM. EXTR: No c/c/e.  Left elbow callous with no erythema, tenderness, effusion or bursitis or warmth.  Full ROM NEURO Normal gait.  PSYCH: Normally interactive. Conversant. Not depressed or anxious appearing.  Calm demeanor.    Assessment and Plan: Continued fever and malaise Anaprox Follow up with DR L Fever log   Signed,  Phillips Odor, MD   [facility] reading (PRIMARY) by  Dr. Dareen Piano.  Chest negative.  [facility] reading (PRIMARY) by  Dr. Dareen Piano.  Sinuses negative.  Results for orders placed in visit on 06/25/13  POCT CBC      Result Value Range   WBC 6.8  4.6 - 10.2 K/uL   Lymph, poc 1.4  0.6 - 3.4   POC LYMPH PERCENT 20.7  10 - 50 %L   MID (cbc) 0.4  0 - 0.9    POC MID % 6.2  0 - 12 %M   POC Granulocyte 5.0  2 - 6.9   Granulocyte percent 73.1  37 - 80 %G   RBC 4.25 (*) 4.69 - 6.13 M/uL   Hemoglobin 14.8  14.1 - 18.1 g/dL   HCT, POC 16.1  09.6 - 53.7 %   MCV 108.5 (*) 80 - 97 fL   MCH, POC 34.8 (*) 27 - 31.2 pg   MCHC 32.1  31.8 - 35.4 g/dL   RDW, POC 04.5     Platelet Count, POC 247  142 - 424 K/uL   MPV 7.9  0 - 99.8 fL  POCT RAPID STREP A (OFFICE)      Result Value Range   Rapid Strep A Screen Negative  Negative

## 2013-09-21 ENCOUNTER — Encounter: Payer: Self-pay | Admitting: Internal Medicine

## 2013-09-21 ENCOUNTER — Ambulatory Visit (INDEPENDENT_AMBULATORY_CARE_PROVIDER_SITE_OTHER): Payer: Managed Care, Other (non HMO) | Admitting: Internal Medicine

## 2013-09-21 VITALS — BP 120/82 | HR 85 | Temp 98.6°F | Ht 67.5 in | Wt 193.6 lb

## 2013-09-21 DIAGNOSIS — J449 Chronic obstructive pulmonary disease, unspecified: Secondary | ICD-10-CM

## 2013-09-21 MED ORDER — PREDNISONE (PAK) 10 MG PO TABS: ORAL_TABLET | ORAL | Status: DC

## 2013-09-21 NOTE — Progress Notes (Signed)
Subjective:     Patient ID: Jared [name], male   DOB: 03/10/1956  MRN: [id number]    Brief patient profile:  56 yowm quit smoking 2003 with COPD PFTs 4/08 FEV1 67% ratio 53% diffusing capacity 98% improved but has plateaued at present doe heavy exertion does ok in yard , walks but no jogging but can do treadmill.     History of Present Illness   03/23/2013 f/u ov/Jery Hollern re copd/ab Chief Complaint  Patient presents with  . COPD    breathing unchanged.   using albuterol avg once a day, rarely exercises but does fine albtuerol helps when he uses it, no cramps from albuterol. rec Add singulair 10 mg one each pm for the next month to see if activity tolerance improves or need for albuterol declines if not stop the singulair and use the albuterol more aggressively, especially before exertion.    09/21/2013 f/u ov/Olamae Ferrara re: copd GOLD II/ saba and laba intol Chief Complaint  Patient presents with  . Follow-up    Increased SOB, tightness and wheezing for x2 months   stopped singulair prior to worsening as didn't think it was really helping   No obvious daytime variabilty or assoc chronic cough or cp  overt sinus or hb symptoms. No unusual exp hx   Sleeping ok without nocturnal  or early am exacerbation  of respiratory  c/o's or need for noct saba. Also denies any obvious fluctuation of symptoms with weather or environmental changes or other aggravating or alleviating factors except as outlined above   ROS  The following are not active complaints unless bolded sore throat, dysphagia, dental problems, itching, sneezing,  nasal congestion or excess/ purulent secretions, ear ache,   fever, chills, sweats, unintended wt loss, pleuritic or exertional cp, hemoptysis,  orthopnea pnd or leg swelling, presyncope, palpitations, heartburn, abdominal pain, anorexia, nausea, vomiting, diarrhea  or change in bowel or urinary habits, change in stools or urine, dysuria,hematuria,  rash, arthralgias, visual  complaints, headache, numbness weakness or ataxia or problems with walking or coordination,  change in mood/affect or memory.                      Objective:   Physical Exam  Pleasant ambulatory white male in no acute distress  wt 187 March 07, 2009  >>182 February 27, 2010 > 184 July 10, 2010 > 12/31/2011  196 > 193 12/31/2012 > 192 03/23/2013>   09/21/2013  193 HEENT mild turbinate edema. Oropharynx no thrush or excess pnd or cobblestoning. No JVD or cervical adenopathy. Mild accessory muscle hypertrophy. Trachea midline, nl thryroid. Chest was hyperinflated by percussion with diminished breath sounds and moderate increased exp time with no exp wheeze. Hoover sign positive at early inspiration. Regular rate and rhythm without murmur gallop or rub or increase P2 no edema. Abd: no hsm, nl excursion. Ext warm without cyanosis or clubbing    CXR  03/23/2013 : Underlying emphysematous change, stable. No edema or  consolidation.       Assessment:

## 2013-09-21 NOTE — Patient Instructions (Addendum)
ok to try qvar up to 4 puffs every 12 hours x at least a week whenever your albuterol need goes up  If not better then try add back the singulair   If all else fails  Prednisone 10 mg take  4 each am x 2 days,   2 each am x 2 days,  1 each am x 2 days and stop   Please schedule a follow up visit in 3 months but call sooner if needed with pft's   Late add : make sure to clarify whether taking singulair or not on next ov

## 2013-09-22 NOTE — Assessment & Plan Note (Addendum)
-   alpha One AT level 137  01/25/03> genotype   03/23/13 =  MM - PFT's 03/2007 FEV 67% with ratio of 53 and DLC0 98%  - PFT's 03/23/2013  FEV 1.25 (38%) ratio of 40 and 34% p B2 and DLCO 97% - HFA 100% 12/31/2011  - Singulair started 03/23/13 ? Benefit   I had an extended discussion with the patient today lasting 15 to 20 minutes of a 25 minute visit on the following issues:  Very limited options since can't tol laba > since appears to respond to streroids try qvar 80 4 bid when flares and if not effective then add back the sinugulair   Add short course prednisone if all else fails  See instructions for specific recommendations which were reviewed directly with the patient who was given a copy with highlighter outlining the key components.

## 2013-11-05 ENCOUNTER — Ambulatory Visit (INDEPENDENT_AMBULATORY_CARE_PROVIDER_SITE_OTHER): Payer: Managed Care, Other (non HMO) | Admitting: Internal Medicine

## 2013-11-05 ENCOUNTER — Encounter: Payer: Self-pay | Admitting: Internal Medicine

## 2013-11-05 VITALS — BP 130/80 | HR 120 | Temp 100.3°F | Ht 67.75 in | Wt 189.8 lb

## 2013-11-05 DIAGNOSIS — J449 Chronic obstructive pulmonary disease, unspecified: Secondary | ICD-10-CM

## 2013-11-05 DIAGNOSIS — J4489 Other specified chronic obstructive pulmonary disease: Secondary | ICD-10-CM

## 2013-11-05 MED ORDER — MONTELUKAST SODIUM 10 MG PO TABS: ORAL_TABLET | ORAL | Status: DC

## 2013-11-05 MED ORDER — ALBUTEROL SULFATE (2.5 MG/3ML) 0.083% IN NEBU: 2.5000 mg | INHALATION_SOLUTION | Freq: Four times a day (QID) | RESPIRATORY_TRACT | Status: DC | PRN

## 2013-11-05 MED ORDER — PREDNISONE (PAK) 10 MG PO TABS: ORAL_TABLET | ORAL | Status: DC

## 2013-11-05 MED ORDER — AZITHROMYCIN 250 MG PO TABS: ORAL_TABLET | ORAL | Status: DC

## 2013-11-05 NOTE — Assessment & Plan Note (Signed)
-   alpha One AT level 137  01/25/03> genotype   03/23/13 =  MM - PFT's 03/2007 FEV 67% with ratio of 53 and DLC0 98%  - PFT's 03/23/2013  FEV 1.25 (38%) ratio of 40 and 34% p B2 to FEV1 of 51%  and DLCO 97% - HFA 100% 12/31/2011  - Singulair started 03/23/13 > > off as of 09/21/13  > worse    Clearly worse off singulair and tolerates laba poorly so restart now and short course pred/ zpak    Each maintenance medication was reviewed in detail including most importantly the difference between maintenance and as needed and under what circumstances the prns are to be used.  Please see instructions for details which were reviewed in writing and the patient given a copy.

## 2013-11-05 NOTE — Progress Notes (Signed)
Subjective:     Patient ID: Jared [name], male   DOB: 03-Apr-1956  MRN: [id number]    Brief patient profile:  56 yowm quit smoking 2003 with COPD PFTs 4/08 FEV1 67% ratio 53% diffusing capacity 98% improved but has plateaued at present doe heavy exertion does ok in yard , walks but no jogging but can do treadmill.     History of Present Illness   03/23/2013 f/u ov/[place] Droll re copd/ab Chief Complaint  Patient presents with  . COPD    breathing unchanged.   using albuterol avg once a day, rarely exercises but does fine albtuerol helps when he uses it, no cramps from albuterol. rec Add singulair 10 mg one each pm for the next month to see if activity tolerance improves or need for albuterol declines if not stop the singulair and use the albuterol more aggressively, especially before exertion.    09/21/2013 f/u ov/Kimberlyann Hollar re: copd GOLD II/ saba and laba intol Chief Complaint  Patient presents with  . Follow-up    Increased SOB, tightness and wheezing for x2 months   stopped singulair prior to worsening as didn't think it was really helping rec ok to try qvar up to 4 puffs every 12 hours x at least a week whenever your albuterol need goes up if not better then try add back the singulair  if all else fails  Prednisone 10 mg take  4 each am x 2 days,   2 each am x 2 days,  1 each am x 2 days and stop    11/05/2013 f/u ov/Keshawn Fiorito re: copd/ ab flare, did not restart singulair  Chief Complaint  Patient presents with  . Acute Visit    Pt c/o increased SOB for the past wk- esp worse at bedtime and first thing in the am. He also c/o wheezing and occ prod cough with minimal green to yellow sputum.   until 2 weeks prior did not note any increase alb but x 2 weeks much more saba, esp in cold weather,  last dose 12 h prior to OV , min am discolored sputum.   No obvious other pattern in day to day or daytime variabilty or assoc  cp or chest tightness, subjective wheeze overt sinus or hb symptoms. No  unusual exp hx or h/o childhood pna/ asthma or knowledge of premature birth.  Sleeping ok without nocturnal   exacerbation  of respiratory  c/o's or need for noct saba. Also denies any obvious fluctuation of symptoms with weather or environmental changes or other aggravating or alleviating factors except as outlined above   Current Medications, Allergies, Complete Past Medical History, Past Surgical History, Family History, and Social History were reviewed in [employer] record.  ROS  The following are not active complaints unless bolded sore throat, dysphagia, dental problems, itching, sneezing,  nasal congestion or excess/ purulent secretions, ear ache,   fever, chills, sweats, unintended wt loss, pleuritic or exertional cp, hemoptysis,  orthopnea pnd or leg swelling, presyncope, palpitations, heartburn, abdominal pain, anorexia, nausea, vomiting, diarrhea  or change in bowel or urinary habits, change in stools or urine, dysuria,hematuria,  rash, arthralgias, visual complaints, headache, numbness weakness or ataxia or problems with walking or coordination,  change in mood/affect or memory.     .                      Objective:   Physical Exam  Pleasant ambulatory white male in no acute distress  wt 187 March 07, 2009  >>182 February 27, 2010 > 184 July 10, 2010 > 12/31/2011  196 > 193 12/31/2012 > 192 03/23/2013>   09/21/2013  193 > 11/05/2013 189  HEENT mild turbinate edema. Oropharynx no thrush or excess pnd or cobblestoning. No JVD or cervical adenopathy. Mild accessory muscle hypertrophy. Trachea midline, nl thryroid. Chest was hyperinflated by percussion with diminished breath sounds and moderate increased exp time with no exp wheeze. Hoover sign positive at early inspiration. Regular rate and rhythm without murmur gallop or rub or increase P2 no edema. Abd: no hsm, nl excursion. Ext warm without cyanosis or clubbing    CXR  03/23/2013 : Underlying emphysematous  change, stable. No edema or  consolidation.       Assessment:

## 2013-11-05 NOTE — Patient Instructions (Addendum)
Plan A = automatic Add back  singulair 10 mg each pm to your spiriva and qvar you are already on  Prednisone 10 mg take  4 each am x 2 days,   2 each am x 2 days,  1 each am x 2 days and stop   Plan B= backup = Only use your albuterol(proair) as a rescue medication to be used if you can't catch your breath by resting or doing a relaxed purse lip breathing pattern.  - The less you use it, the better it will work when you need it. - Ok to use up to 2pffs  every 4 hours if you must but call for immediate appointment if use goes up over your usual need - Don't leave home without it !!  (think of it like your spare tire for your car)  - if albuterol hfa (proair) not helping ok to use the nebulizer up to every 4 hours but call immediately for an appointment.   Please schedule a follow up office visit in 6 weeks, call sooner if needed with pfts

## 2013-12-17 ENCOUNTER — Ambulatory Visit: Payer: Managed Care, Other (non HMO) | Admitting: Internal Medicine

## 2014-01-11 ENCOUNTER — Other Ambulatory Visit: Payer: Self-pay | Admitting: Internal Medicine

## 2014-01-22 ENCOUNTER — Other Ambulatory Visit: Payer: Self-pay | Admitting: Internal Medicine

## 2014-02-07 ENCOUNTER — Ambulatory Visit (INDEPENDENT_AMBULATORY_CARE_PROVIDER_SITE_OTHER): Payer: Managed Care, Other (non HMO) | Admitting: Family Medicine

## 2014-02-07 VITALS — BP 128/82 | HR 107 | Temp 99.0°F | Resp 20 | Ht 67.0 in | Wt 192.0 lb

## 2014-02-07 DIAGNOSIS — J209 Acute bronchitis, unspecified: Secondary | ICD-10-CM

## 2014-02-07 DIAGNOSIS — J029 Acute pharyngitis, unspecified: Secondary | ICD-10-CM

## 2014-02-07 MED ORDER — AZITHROMYCIN 250 MG PO TABS: ORAL_TABLET | ORAL | Status: DC

## 2014-02-07 NOTE — Progress Notes (Signed)
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 Patient has history of COPD. Said no shortness of breath he has had some sore throat along with this. No nausea vomiting.  Objective: No acute distress HEENT: Mild uvula swelling, mild posterior pharyngeal erythema Chest: Few rhonchi otherwise clear Heart: Regular no murmur Skin: No suspicious lesions or rash  Assessment: Bronchitis   Acute bronchitis - Plan: azithromycin (ZITHROMAX Z-PAK) 250 MG tablet  Sore throat - Plan: azithromycin (ZITHROMAX Z-PAK) 250 MG tablet  Signed, Elvina SidleKurt Jetty Berland, MD

## 2014-02-08 ENCOUNTER — Ambulatory Visit: Payer: Managed Care, Other (non HMO) | Admitting: Internal Medicine

## 2014-02-10 ENCOUNTER — Telehealth: Payer: Self-pay

## 2014-02-10 DIAGNOSIS — J209 Acute bronchitis, unspecified: Secondary | ICD-10-CM

## 2014-02-10 DIAGNOSIS — J029 Acute pharyngitis, unspecified: Secondary | ICD-10-CM

## 2014-02-10 MED ORDER — AZITHROMYCIN 250 MG PO TABS: ORAL_TABLET | ORAL | Status: DC

## 2014-02-10 NOTE — Telephone Encounter (Signed)
Patient wife called back to change the contact number. Please call [phone] instead.

## 2014-02-10 NOTE — Telephone Encounter (Signed)
Spoke to pt he stated his cough is worse, he is usually given two rounds of treatment to treat this type of cough, he would like a refill of the z-pack.  Please advise.

## 2014-02-10 NOTE — Telephone Encounter (Signed)
[phone] PATIENTS WIFE IS CALLING TO SAY THAT Naif IS WORSE, SHE SAYS WHAT WE USUALLY DO IS GIVE HIM ANOTHER ZPAK PLEASE CALL HER ON HER CELL

## 2014-02-11 ENCOUNTER — Ambulatory Visit (INDEPENDENT_AMBULATORY_CARE_PROVIDER_SITE_OTHER): Payer: Managed Care, Other (non HMO) | Admitting: Family Medicine

## 2014-02-11 ENCOUNTER — Ambulatory Visit: Payer: Managed Care, Other (non HMO)

## 2014-02-11 VITALS — BP 126/78 | HR 106 | Temp 99.7°F | Resp 17 | Ht 68.0 in | Wt 192.0 lb

## 2014-02-11 DIAGNOSIS — J441 Chronic obstructive pulmonary disease with (acute) exacerbation: Secondary | ICD-10-CM

## 2014-02-11 DIAGNOSIS — R000 Tachycardia, unspecified: Secondary | ICD-10-CM

## 2014-02-11 DIAGNOSIS — R05 Cough: Secondary | ICD-10-CM

## 2014-02-11 DIAGNOSIS — R059 Cough, unspecified: Secondary | ICD-10-CM

## 2014-02-11 DIAGNOSIS — L723 Sebaceous cyst: Secondary | ICD-10-CM

## 2014-02-11 LAB — POCT INFLUENZA A/B
INFLUENZA A, POC: NEGATIVE
INFLUENZA B, POC: NEGATIVE

## 2014-02-11 MED ORDER — PREDNISONE 10 MG PO TABS: ORAL_TABLET | ORAL | Status: DC

## 2014-02-11 MED ORDER — DOXYCYCLINE HYCLATE 100 MG PO CAPS: 100.0000 mg | ORAL_CAPSULE | Freq: Two times a day (BID) | ORAL | Status: DC

## 2014-02-11 NOTE — Patient Instructions (Signed)
Take the prednisone as directed.  If you are not feeling better soon please give me or Dr. Sherene SiresWert a call- sooner if you are worse.   You can add the doxycycline also, or wait and see how things go over the next couple of days if you'd rather.

## 2014-02-11 NOTE — Telephone Encounter (Signed)
Pt came in to see Dr Patsy Lageropland today and was Rxd Abx.

## 2014-02-11 NOTE — Progress Notes (Signed)
[facility] [address] [place] [address] [phone]  Date:  02/11/2014   Name:  Jared [name]   DOB:  07/29/1956   MRN:  [id number]  PCP:  Tally Due, MD    Chief Complaint: Sore Throat and URI   History of Present Illness:  Jared [name] is a 58 y.o. very pleasant male patient who presents with the following:  He is here today with ilness.  He was here on Monday wih a ST and cough for 3 days.  Treated with a zpack  He has not felt feverish, he has not noted body aches or chills. However he feels this his respiratory sx are getting worse, like COPD exacerbations he has had in the past.  He does have a productive cough.  He does feel a little SOB, but not really worse than his baseline.   He just finished his zpack today; he has felt that he has continued to get worse.   His chest may hurt with coughing but never otherwise.   He was a heavy smoker in the past- he quit approx 2003.  He is on singulair, spiriva, quar, albuterol  He does not use oxyen at home Patient Active Problem List   Diagnosis Date Noted  . FEVER UNSPECIFIED 08/11/2010  . FATIQUE AND MALAISE 07/10/2010  . RHINITIS 10/27/2007  . COPD GOLD II with reversbility  10/27/2007    Past Medical History  Diagnosis Date  . COPD (chronic obstructive pulmonary disease)   . Rhinitis     No past surgical history on file.  History  Substance Use Topics  . Smoking status: Former Smoker -- 2.00 packs/day for 20 years    Types: Cigarettes    Quit date: 11/08/2002  . Smokeless tobacco: Never Used  . Alcohol Use: No    Family History  Problem Relation Age of Onset  . Emphysema Mother     smoked  . Lung cancer Mother     smoked    Allergies  Allergen Reactions  . Aspirin   . Bupropion Hcl   . Penicillins     Medication list has been reviewed and updated.  Current Outpatient Prescriptions on File Prior to Visit  Medication Sig Dispense Refill  . albuterol  (PROVENTIL) (2.5 MG/3ML) 0.083% nebulizer solution Take 3 mLs (2.5 mg total) by nebulization every 6 (six) hours as needed for wheezing or shortness of breath.  75 mL  2  . montelukast (SINGULAIR) 10 MG tablet take 1 tablet by mouth once daily at bedtime  30 tablet  11  . PROAIR HFA 108 (90 BASE) MCG/ACT inhaler inhale 2 puffs by mouth INTO THE LUNGS every 4 hours if needed for wheezing  8.5 g  11  . QVAR 80 MCG/ACT inhaler inhale 2 puffs by mouth INTO THE LUNGS twice a day  8.7 g  11  . SPIRIVA HANDIHALER 18 MCG inhalation capsule INHALE THE CONTENTS OF 1 CAPSULE IN THE HANDIHALER ONCE DAILY  30 capsule  11  . azithromycin (ZITHROMAX Z-PAK) 250 MG tablet Take as directed on pack  6 tablet  0   No current facility-administered medications on file prior to visit.    Review of Systems:  As per HPI- otherwise negative.   Physical Examination: Filed Vitals:   02/11/14 1104  BP: 126/78  Pulse: 125  Temp: 99.7 F (37.6 C)  Resp: 17   Filed Vitals:   02/11/14 1104  Height: 5\' 8"  (1.727 m)  Weight: 192 lb (87.091 kg)   Body mass index is 29.2 kg/(m^2). Ideal Body Weight: Weight in (lb) to have BMI = 25: 164.1  GEN: WDWN, NAD, Non-toxic, A & O x 3, overweight, looks well HEENT: Atraumatic, Normocephalic. Neck supple. No masses, No LAD.  Bilateral TM wnl, oropharynx normal.  PEERL,EOMI.   Ears and Nose: No external deformity. CV: RRR, No M/G/R. No JVD. No thrill. No extra heart sounds. PULM: CTA B, no wheezes, crackles, rhonchi. No retractions. No resp. distress. No accessory muscle use. ABD: S, NT, ND. No rebound. No HSM. EXTR: No c/c/e NEURO Normal gait.  PSYCH: Normally interactive. Conversant. Not depressed or anxious appearing.  Calm demeanor.   [facility] reading (PRIMARY) by  Dr. Patsy Lageropland. COPD, no infection  CHEST 2 VIEW  COMPARISON: DG CHEST 2V dated 06/25/2013  FINDINGS: There is no focal parenchymal opacity, pleural effusion, or pneumothorax. The heart and mediastinal  contours are unremarkable.  The osseous structures are unremarkable.  IMPRESSION: No active cardiopulmonary disease.   Results for orders placed in visit on 02/11/14  POCT INFLUENZA A/B      Result Value Ref Range   Influenza A, POC Negative     Influenza B, POC Negative      Assessment and Plan: COPD exacerbation - Plan: POCT Influenza A/B, DG Chest 2 View, predniSONE (DELTASONE) 10 MG tablet, doxycycline (VIBRAMYCIN) 100 MG capsule  Cough  Tachycardia  Likely COPD exacerbation.  Will treat with prednisone today.  Also gave an rx for doxycycline.  As he just took azithromycin he may choose to hold this and start if he is not better in the next couple of days.   He is often tachycardic per chart review- Caryn BeeKevin corroborates this history.  Discussed possiblity of more serious etiology such as PE and offered a d dimer.  He feels comfortable that this mild tachycardia is his baseline and declines further evaluation today.   Offered a k check for him due to possible medication interactions; he delcined today  Meds ordered this encounter  Medications  . predniSONE (DELTASONE) 10 MG tablet    Sig: Take 4 pills a day for 3 days, 2 pills a day for 3 days, 1 pill a day for 3 days    Dispense:  23 tablet    Refill:  0  . doxycycline (VIBRAMYCIN) 100 MG capsule    Sig: Take 1 capsule (100 mg total) by mouth 2 (two) times daily.    Dispense:  20 capsule    Refill:  0  ' Signed Abbe AmsterdamJessica Mitsuru Dault, MD

## 2014-03-02 ENCOUNTER — Other Ambulatory Visit: Payer: Self-pay | Admitting: Internal Medicine

## 2014-03-02 DIAGNOSIS — J449 Chronic obstructive pulmonary disease, unspecified: Secondary | ICD-10-CM

## 2014-03-03 ENCOUNTER — Encounter: Payer: Self-pay | Admitting: Internal Medicine

## 2014-03-03 ENCOUNTER — Ambulatory Visit (INDEPENDENT_AMBULATORY_CARE_PROVIDER_SITE_OTHER): Payer: Managed Care, Other (non HMO) | Admitting: Internal Medicine

## 2014-03-03 VITALS — BP 128/80 | HR 90 | Temp 98.7°F | Ht 68.0 in | Wt 192.0 lb

## 2014-03-03 DIAGNOSIS — J449 Chronic obstructive pulmonary disease, unspecified: Secondary | ICD-10-CM

## 2014-03-03 DIAGNOSIS — J4489 Other specified chronic obstructive pulmonary disease: Secondary | ICD-10-CM

## 2014-03-03 LAB — PULMONARY FUNCTION TEST
DL/VA % PRED: 123 %
DL/VA: 5.55 ml/min/mmHg/L
DLCO unc % pred: 103 %
DLCO unc: 30.79 ml/min/mmHg
FEF 25-75 Post: 0.92 L/sec
FEF 25-75 Pre: 0.49 L/sec
FEF2575-%CHANGE-POST: 87 %
FEF2575-%PRED-PRE: 16 %
FEF2575-%Pred-Post: 30 %
FEV1-%CHANGE-POST: 32 %
FEV1-%PRED-POST: 48 %
FEV1-%PRED-PRE: 36 %
FEV1-POST: 1.71 L
FEV1-Pre: 1.29 L
FEV1FVC-%CHANGE-POST: 9 %
FEV1FVC-%Pred-Pre: 61 %
FEV6-%CHANGE-POST: 20 %
FEV6-%PRED-PRE: 58 %
FEV6-%Pred-Post: 70 %
FEV6-PRE: 2.58 L
FEV6-Post: 3.11 L
FEV6FVC-%Change-Post: 0 %
FEV6FVC-%PRED-PRE: 99 %
FEV6FVC-%Pred-Post: 98 %
FVC-%Change-Post: 21 %
FVC-%Pred-Post: 71 %
FVC-%Pred-Pre: 59 %
FVC-PRE: 2.7 L
FVC-Post: 3.29 L
POST FEV1/FVC RATIO: 52 %
POST FEV6/FVC RATIO: 94 %
PRE FEV6/FVC RATIO: 95 %
Pre FEV1/FVC ratio: 48 %
RV % PRED: 134 %
RV: 2.73 L
TLC % PRED: 94 %
TLC: 6.22 L

## 2014-03-03 MED ORDER — FLUTICASONE FUROATE-VILANTEROL 100-25 MCG/INH IN AEPB: 1.0000 | INHALATION_SPRAY | Freq: Every morning | RESPIRATORY_TRACT | Status: DC

## 2014-03-03 NOTE — Progress Notes (Signed)
PFT done today. 

## 2014-03-03 NOTE — Assessment & Plan Note (Addendum)
-   alpha One AT level 137  01/25/03> genotype   03/23/13 =  MM - PFT's 03/2007 FEV 67% with ratio of 53 and DLC0 98%  - PFT's 03/23/2013  FEV 1.25 (38%) ratio of 40 and 34% p B2 to FEV1 of 51%  and DLCO 97% - PFTs 03/03/2014   FEV 1.29(36%) ratio 48 with 32% better p B2 to FEV1 48% and DLCO 103%  - HFA 100% 12/31/2011  - Singulair started 03/23/13 > > off as of 09/21/13  > worse 11/05/2013 so restarted   He has technically slipped from a gold II to III and using a lot of albuterol, probably more than he admits, to get the intermittent benefit of a saba when, if we could get him to tol a laba, he would probably do a lot better.   rec trial of breo one puff each am x 6 week trial    Each maintenance medication was reviewed in detail including most importantly the difference between maintenance and as needed and under what circumstances the prns are to be used.  Please see instructions for details which were reviewed in writing and the patient given a copy.

## 2014-03-03 NOTE — Progress Notes (Signed)
Subjective:     Patient ID: Jared [name], male   DOB: 1956/01/05  MRN: [id number]    Brief patient profile:  56 yowm quit smoking 2003 with COPD PFTs 4/08 FEV1 67% ratio 53% diffusing capacity 98% improved but has plateaued at present doe heavy exertion does ok in yard , walks but no jogging but can do treadmill.     History of Present Illness   03/23/2013 f/u ov/Tilda Samudio re copd/ab Chief Complaint  Patient presents with  . COPD    breathing unchanged.   using albuterol avg once a day, rarely exercises but does fine albtuerol helps when he uses it, no cramps from albuterol. rec Add singulair 10 mg one each pm for the next month to see if activity tolerance improves or need for albuterol declines if not stop the singulair and use the albuterol more aggressively, especially before exertion.      11/05/2013 f/u ov/Kaisey Huseby re: copd/ ab flare, did not restart singulair  Chief Complaint  Patient presents with  . Acute Visit    Pt c/o increased SOB for the past wk- esp worse at bedtime and first thing in the am. He also c/o wheezing and occ prod cough with minimal green to yellow sputum.   until 2 weeks prior did not note any increase alb but x 2 weeks much more saba, esp in cold weather,  last dose 12 h prior to OV , min am discolored sputum. rec Plan A = automatic Add back  singulair 10 mg each pm to your spiriva and qvar you are already on Prednisone 10 mg take  4 each am x 2 days,   2 each am x 2 days,  1 each am x 2 days and stop  Plan B= backup = Only use your albuterol(proair) as a rescue medication   03/03/2014 f/u ov/Royelle Hinchman re: GOLD II copd/reversible component but intol to laba/ maint rx spiriva/singulari/qvar Chief Complaint  Patient presents with  . Followup with PFT    Pt states that his breathing has improved since last visit. No new co's today. He is using rescue inhaler on average 4 times per wk.      No obvious other pattern in day to day or daytime variabilty or assoc  cp  or chest tightness, subjective wheeze overt sinus or hb symptoms. No unusual exp hx or h/o childhood pna/ asthma or knowledge of premature birth.  Sleeping ok without nocturnal   exacerbation  of respiratory  c/o's or need for noct saba. Also denies any obvious fluctuation of symptoms with weather or environmental changes or other aggravating or alleviating factors except as outlined above   Current Medications, Allergies, Complete Past Medical History, Past Surgical History, Family History, and Social History were reviewed in [employer] record.  ROS  The following are not active complaints unless bolded sore throat, dysphagia, dental problems, itching, sneezing,  nasal congestion or excess/ purulent secretions, ear ache,   fever, chills, sweats, unintended wt loss, pleuritic or exertional cp, hemoptysis,  orthopnea pnd or leg swelling, presyncope, palpitations, heartburn, abdominal pain, anorexia, nausea, vomiting, diarrhea  or change in bowel or urinary habits, change in stools or urine, dysuria,hematuria,  rash, arthralgias, visual complaints, headache, numbness weakness or ataxia or problems with walking or coordination,  change in mood/affect or memory.     .                      Objective:   Physical  Exam  Pleasant ambulatory white male in no acute distress  wt 187 March 07, 2009  >>182 February 27, 2010 > 184 July 10, 2010 > 12/31/2011  196 > 193 12/31/2012 > 192 03/23/2013>   09/21/2013  193 > 11/05/2013 189 > 03/03/2014  192 HEENT mild turbinate edema. Oropharynx no thrush or excess pnd or cobblestoning. No JVD or cervical adenopathy. Mild accessory muscle hypertrophy. Trachea midline, nl thryroid. Chest was hyperinflated by percussion with diminished breath sounds and moderate increased exp time with no exp wheeze. Hoover sign positive at early inspiration. Regular rate and rhythm without murmur gallop or rub or increase P2 no edema. Abd: no hsm, nl excursion.  Ext warm without cyanosis or clubbing    CXR  03/23/2013 : Underlying emphysematous change, stable. No edema or  consolidation.       Assessment:

## 2014-03-03 NOTE — Patient Instructions (Signed)
Try Breo 2 puffs each am from one click and off qvar and if you like it, fill it   Please schedule a follow up visit in 3 months but call sooner if needed

## 2014-05-10 ENCOUNTER — Ambulatory Visit (INDEPENDENT_AMBULATORY_CARE_PROVIDER_SITE_OTHER): Payer: Managed Care, Other (non HMO) | Admitting: Family Medicine

## 2014-05-10 VITALS — BP 126/80 | HR 99 | Temp 99.4°F | Resp 18 | Ht 67.3 in | Wt 186.2 lb

## 2014-05-10 DIAGNOSIS — L089 Local infection of the skin and subcutaneous tissue, unspecified: Secondary | ICD-10-CM

## 2014-05-10 DIAGNOSIS — D7589 Other specified diseases of blood and blood-forming organs: Secondary | ICD-10-CM

## 2014-05-10 DIAGNOSIS — F1020 Alcohol dependence, uncomplicated: Secondary | ICD-10-CM

## 2014-05-10 DIAGNOSIS — W57XXXA Bitten or stung by nonvenomous insect and other nonvenomous arthropods, initial encounter: Principal | ICD-10-CM

## 2014-05-10 DIAGNOSIS — S80869A Insect bite (nonvenomous), unspecified lower leg, initial encounter: Secondary | ICD-10-CM

## 2014-05-10 DIAGNOSIS — S80262A Insect bite (nonvenomous), left knee, initial encounter: Principal | ICD-10-CM

## 2014-05-10 LAB — POCT CBC
GRANULOCYTE PERCENT: 73.4 % (ref 37–80)
HCT, POC: 44.8 % (ref 43.5–53.7)
Hemoglobin: 14.8 g/dL (ref 14.1–18.1)
Lymph, poc: 1.5 (ref 0.6–3.4)
MCH: 34.8 pg — AB (ref 27–31.2)
MCHC: 33 g/dL (ref 31.8–35.4)
MCV: 105.5 fL — AB (ref 80–97)
MID (CBC): 0.5 (ref 0–0.9)
MPV: 8.2 fL (ref 0–99.8)
PLATELET COUNT, POC: 259 10*3/uL (ref 142–424)
POC Granulocyte: 5.6 (ref 2–6.9)
POC LYMPH %: 20.2 % (ref 10–50)
POC MID %: 6.4 % (ref 0–12)
RBC: 4.25 M/uL — AB (ref 4.69–6.13)
RDW, POC: 13.1 %
WBC: 7.6 10*3/uL (ref 4.6–10.2)

## 2014-05-10 LAB — POCT SEDIMENTATION RATE: POCT SED RATE: 32 mm/hr — AB (ref 0–22)

## 2014-05-10 MED ORDER — DOXYCYCLINE HYCLATE 100 MG PO CAPS: 100.0000 mg | ORAL_CAPSULE | Freq: Two times a day (BID) | ORAL | Status: DC

## 2014-05-10 NOTE — Patient Instructions (Signed)
Tick Bite Information Ticks are insects that attach themselves to the skin and draw blood for food. There are various types of ticks. Common types include wood ticks and deer ticks. Most ticks live in shrubs and grassy areas. Ticks can climb onto your body when you make contact with leaves or grass where the tick is waiting. The most common places on the body for ticks to attach themselves are the scalp, neck, armpits, waist, and groin. Most tick bites are harmless, but sometimes ticks carry germs that cause diseases. These germs can be spread to a person during the tick's feeding process. The chance of a disease spreading through a tick bite depends on:   The type of tick.  Time of year.   How long the tick is attached.   Geographic location.  HOW CAN YOU PREVENT TICK BITES? Take these steps to help prevent tick bites when you are outdoors:  Wear protective clothing. Long sleeves and long pants are best.   Wear white clothes so you can see ticks more easily.  Tuck your pant legs into your socks.   If walking on a trail, stay in the middle of the trail to avoid brushing against bushes.  Avoid walking through areas with long grass.  Put insect repellent on all exposed skin and along boot tops, pant legs, and sleeve cuffs.   Check clothing, hair, and skin repeatedly and before going inside.   Brush off any ticks that are not attached.  Take a shower or bath as soon as possible after being outdoors.  WHAT IS THE PROPER WAY TO REMOVE A TICK? Ticks should be removed as soon as possible to help prevent diseases caused by tick bites. 1. If latex gloves are available, put them on before trying to remove a tick.  2. Using fine-point tweezers, grasp the tick as close to the skin as possible. You may also use curved forceps or a tick removal tool. Grasp the tick as close to its head as possible. Avoid grasping the tick on its body. 3. Pull gently with steady upward pressure until  the tick lets go. Do not twist the tick or jerk it suddenly. This may break off the tick's head or mouth parts. 4. Do not squeeze or crush the tick's body. This could force disease-carrying fluids from the tick into your body.  5. After the tick is removed, wash the bite area and your hands with soap and water or other disinfectant such as alcohol. 6. Apply a small amount of antiseptic cream or ointment to the bite site.  7. Wash and disinfect any instruments that were used.  Do not try to remove a tick by applying a hot match, petroleum jelly, or fingernail polish to the tick. These methods do not work and may increase the chances of disease being spread from the tick bite.  WHEN SHOULD YOU SEEK MEDICAL CARE? Contact your health care provider if you are unable to remove a tick from your skin or if a part of the tick breaks off and is stuck in the skin.  After a tick bite, you need to be aware of signs and symptoms that could be related to diseases spread by ticks. Contact your health care provider if you develop any of the following in the days or weeks after the tick bite:  Unexplained fever.  Rash. A circular rash that appears days or weeks after the tick bite may indicate the possibility of Lyme disease. The rash may resemble   a target with a bull's-eye and may occur at a different part of your body than the tick bite.  Redness and swelling in the area of the tick bite.   Tender, swollen lymph glands.   Diarrhea.   Weight loss.   Cough.   Fatigue.   Muscle, joint, or bone pain.   Abdominal pain.   Headache.   Lethargy or a change in your level of consciousness.  Difficulty walking or moving your legs.   Numbness in the legs.   Paralysis.  Shortness of breath.   Confusion.   Repeated vomiting.  Document Released: 11/22/2000 Document Revised: 09/15/2013 Document Reviewed: 05/05/2013 [employer] Patient Information 2014 [employer].  

## 2014-05-10 NOTE — Progress Notes (Signed)
Subjective:    Patient ID: Jared GraffKevin Denne, male    DOB: 09/20/1956, 58 y.o.   MRN: [id number] Chief Complaint  Patient presents with  . Tick Removal    bite on back of lt leg x1 week    HPI  Mr. Luvenia ReddenKillinger is here with his wife - he found a tick on the back of his left knee 8d prior - removed immediately and had red bump around it. Yesterday he noticed that the redness had spread significantly - it is very itchy, not painful. Absolutely no systemic sxs. No f/c/HAs/n/v/myalgias/arthralgias. No illness - feels fine otherwise.  No h/o anemia, no fatigue, drinks alcohol socially.  Past Medical History  Diagnosis Date  . COPD (chronic obstructive pulmonary disease)   . Rhinitis    Current Outpatient Prescriptions on File Prior to Visit  Medication Sig Dispense Refill  . Fluticasone Furoate-Vilanterol (BREO ELLIPTA) 100-25 MCG/INH AEPB Inhale 1 puff into the lungs every morning.  28 each  11  . PROAIR HFA 108 (90 BASE) MCG/ACT inhaler inhale 2 puffs by mouth INTO THE LUNGS every 4 hours if needed for wheezing  8.5 g  11  . SPIRIVA HANDIHALER 18 MCG inhalation capsule INHALE THE CONTENTS OF 1 CAPSULE IN THE HANDIHALER ONCE DAILY  30 capsule  11   No current facility-administered medications on file prior to visit.   Allergies  Allergen Reactions  . Aspirin   . Bupropion Hcl   . Penicillins Hives     Review of Systems  Constitutional: Negative for fever, chills, activity change and appetite change.  Cardiovascular: Negative for leg swelling.  Gastrointestinal: Negative for nausea, vomiting, abdominal pain, diarrhea and constipation.  Musculoskeletal: Negative for arthralgias, gait problem, joint swelling, myalgias, neck pain and neck stiffness.  Skin: Positive for color change, rash and wound. Negative for pallor.  Neurological: Negative for dizziness, weakness, light-headedness, numbness and headaches.  Hematological: Negative for adenopathy. Does not bruise/bleed easily.    Psychiatric/Behavioral: Negative for dysphoric mood. The patient is not nervous/anxious.       BP 126/80  Pulse 99  Temp(Src) 99.4 F (37.4 C) (Oral)  Resp 18  Ht 5' 7.3" (1.709 m)  Wt 186 lb 3.2 oz (84.46 kg)  BMI 28.92 kg/m2  SpO2 100% Objective:   Physical Exam  Constitutional: He is oriented to person, place, and time. He appears well-developed and well-nourished. No distress.  HENT:  Head: Normocephalic and atraumatic.  Eyes: No scleral icterus.  Pulmonary/Chest: Effort normal.  Neurological: He is alert and oriented to person, place, and time.  Skin: Skin is warm and dry. Rash noted. Rash is macular and nodular. He is not diaphoretic.     On left lateral popliteal fossa was blanching bright erythema well-defined around central mildly indurated nodule. No target rash, no rash on extremities. No warmth, tenderness, or drainage.  Psychiatric: He has a normal mood and affect. His behavior is normal.          Results for orders placed in visit on 05/10/14  POCT CBC      Result Value Ref Range   WBC 7.6  4.6 - 10.2 K/uL   Lymph, poc 1.5  0.6 - 3.4   POC LYMPH PERCENT 20.2  10 - 50 %L   MID (cbc) 0.5  0 - 0.9   POC MID % 6.4  0 - 12 %M   POC Granulocyte 5.6  2 - 6.9   Granulocyte percent 73.4  37 - 80 %G  RBC 4.25 (*) 4.69 - 6.13 M/uL   Hemoglobin 14.8  14.1 - 18.1 g/dL   HCT, POC 27.0  35.0 - 53.7 %   MCV 105.5 (*) 80 - 97 fL   MCH, POC 34.8 (*) 27 - 31.2 pg   MCHC 33.0  31.8 - 35.4 g/dL   RDW, POC 09.3     Platelet Count, POC 259  142 - 424 K/uL   MPV 8.2  0 - 99.8 fL   Assessment & Plan:   Tick bite of left popliteal region with infection - Plan: POCT CBC, POCT SEDIMENTATION RATE - cover w/ doxy - suspect cellulitis rather than tick-born disease - outlined w/ skinscribe marker - RTc immed if worsening or any systemic sxs dev or additional rash. RTC if not sig improvement in 2-3d.  Macrocytosis - Plan: Vitamin B12, Folate, Vitamin B1, CANCELED: Vitamin B1,  whole blood Please note pt does NOT HAVE ALCOHOL DEPENDENCE. This was entered in error by CMA after being specifically instructed that this was NOT the correct diagnosis - labs were linked to it so I can't remove it. Pt drinks alcohol socially only - absolutely no concern for overuse or dependence.   Meds ordered this encounter  Medications  . doxycycline (VIBRAMYCIN) 100 MG capsule    Sig: Take 1 capsule (100 mg total) by mouth 2 (two) times daily.    Dispense:  20 capsule    Refill:  0   Norberto Sorenson, MD MPH

## 2014-05-11 LAB — VITAMIN B12: VITAMIN B 12: 383 pg/mL (ref 211–911)

## 2014-05-11 LAB — FOLATE: Folate: 16.5 ng/mL

## 2014-05-16 LAB — VITAMIN B1: Vitamin B1 (Thiamine): 16 nmol/L (ref 8–30)

## 2014-06-09 IMAGING — CR DG SINUSES COMPLETE 3+V
3 series · 3 of 3 positions shown · non-contrast
Comparison: Sinus CT scan 07/02/2007.

CLINICAL DATA: Fever.  Sinus drainage.

PARANASAL SINUSES - COMPLETE 3 + VIEW

[PA]
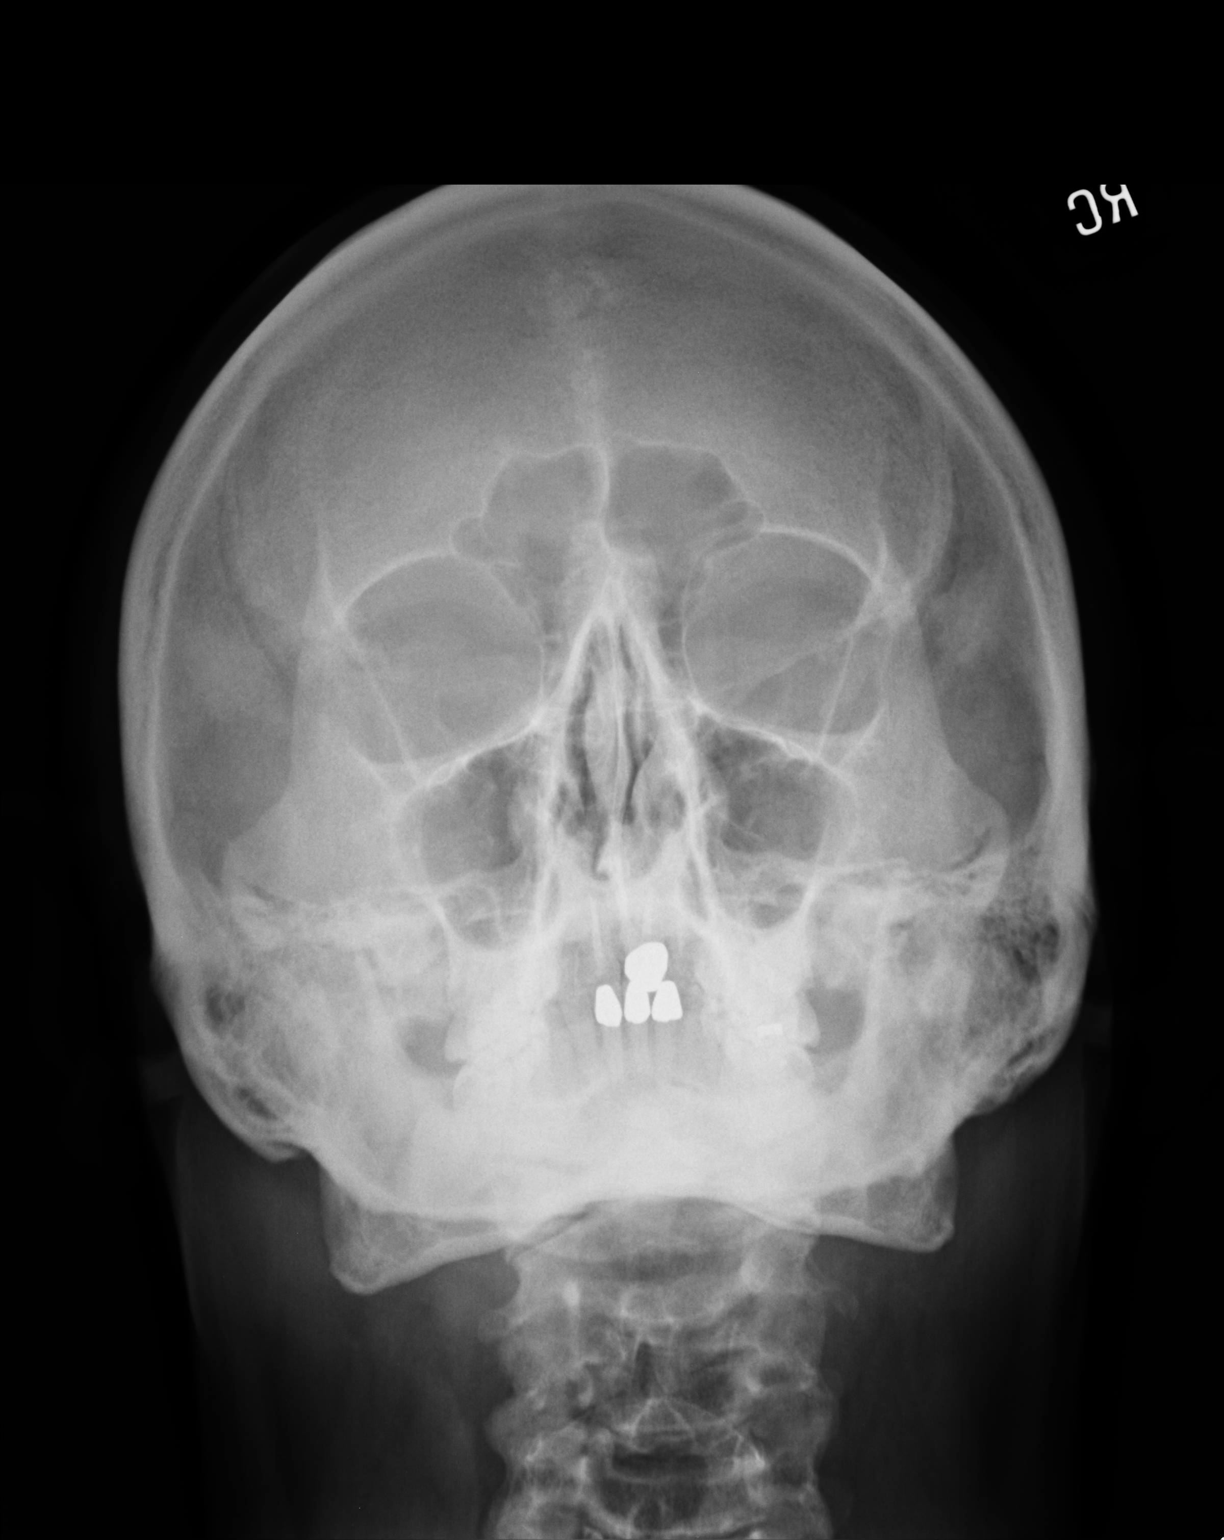

[waters]
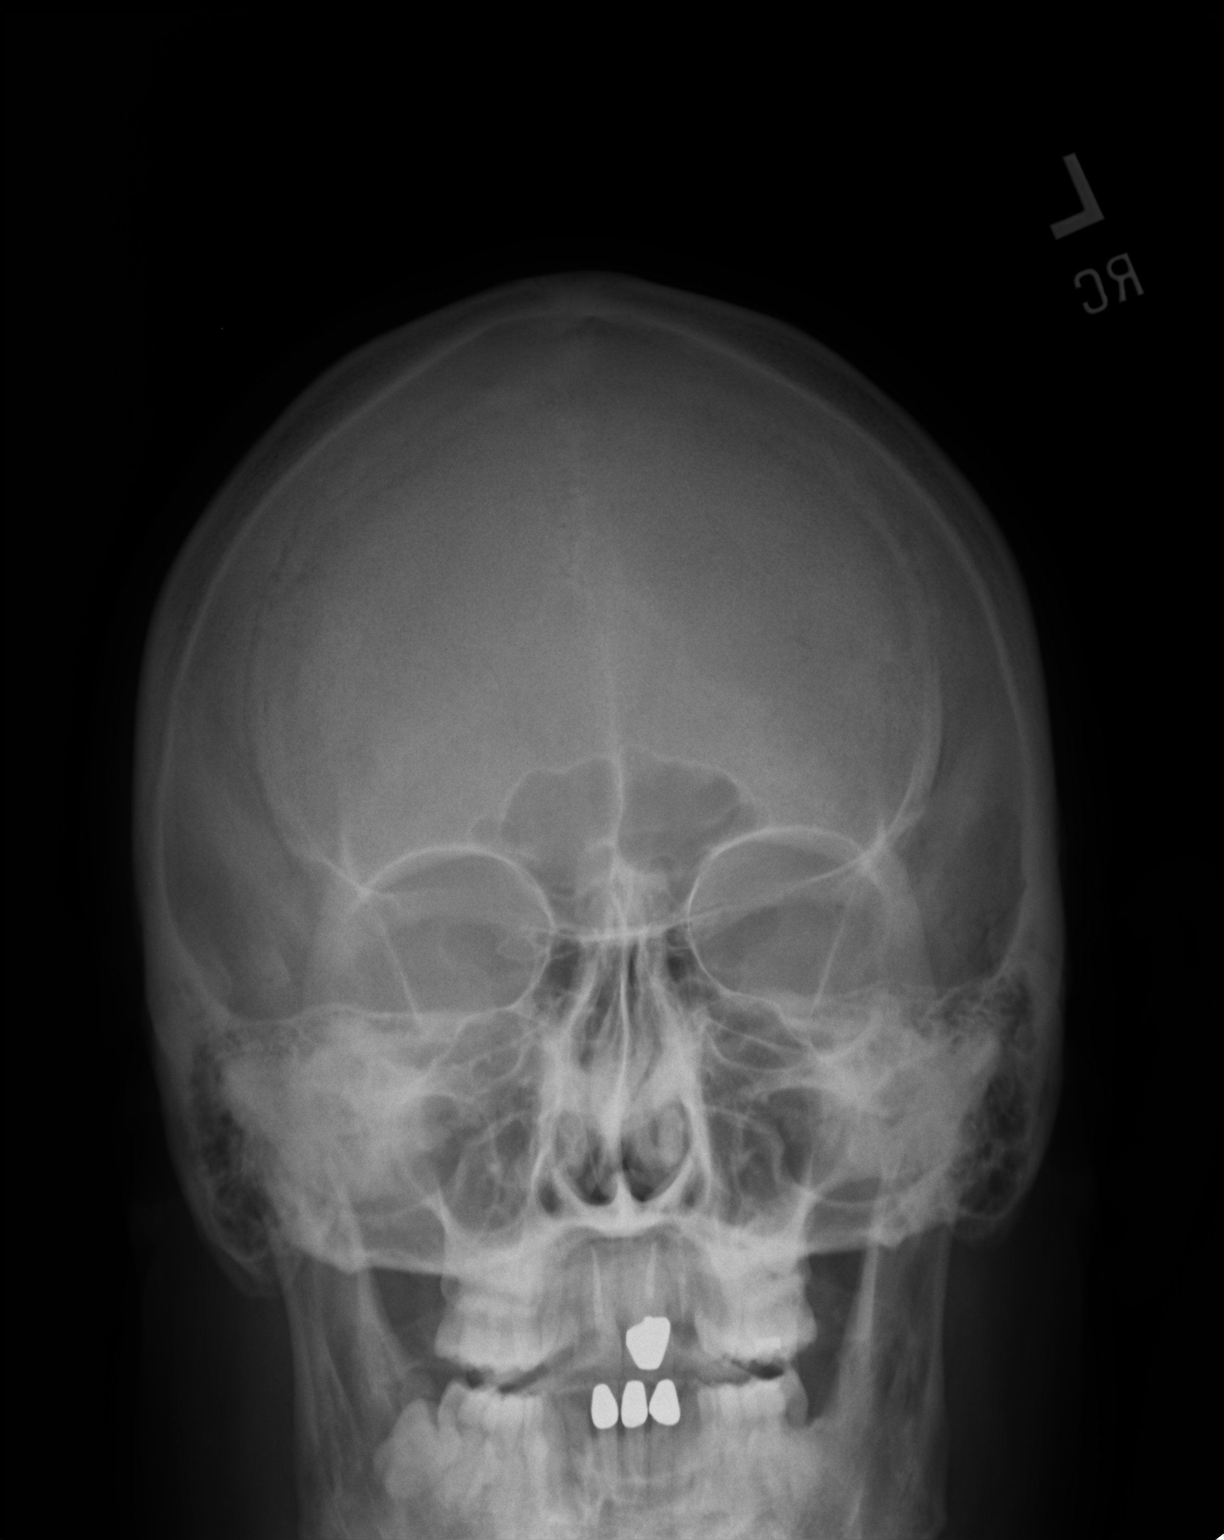

[lateral]
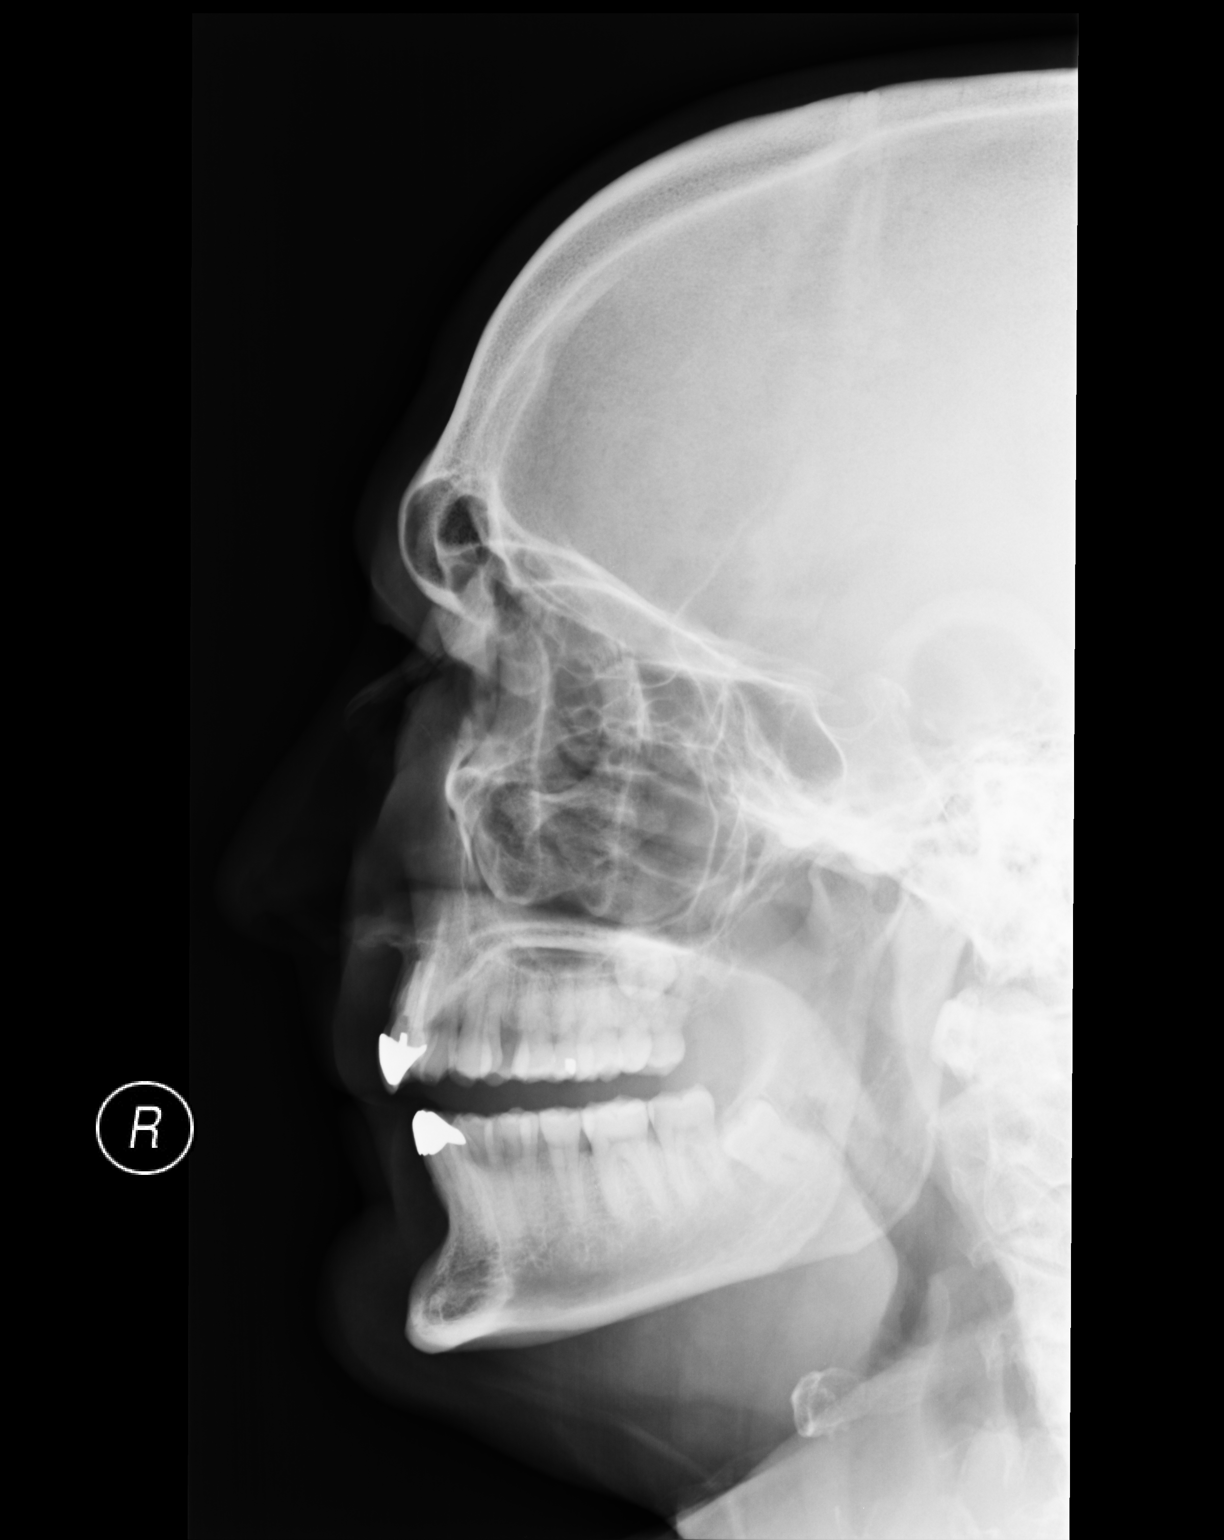

[3 of 3 positions shown; findings below may reference images not displayed]

FINDINGS: Three views of the paranasal sinuses demonstrate no air
fluid levels.  Paranasal sinuses appeared be well pneumatized.
IMPRESSION: No findings to suggest acute sinusitis on today's examination.

Clinically significant discrepancy from primary report, if
provided: None

## 2014-06-11 ENCOUNTER — Ambulatory Visit (INDEPENDENT_AMBULATORY_CARE_PROVIDER_SITE_OTHER): Payer: Managed Care, Other (non HMO) | Admitting: Internal Medicine

## 2014-06-11 ENCOUNTER — Ambulatory Visit (INDEPENDENT_AMBULATORY_CARE_PROVIDER_SITE_OTHER): Payer: Managed Care, Other (non HMO)

## 2014-06-11 VITALS — BP 132/80 | HR 128 | Temp 99.5°F | Resp 18 | Ht 67.0 in | Wt 182.0 lb

## 2014-06-11 DIAGNOSIS — S20212A Contusion of left front wall of thorax, initial encounter: Secondary | ICD-10-CM

## 2014-06-11 DIAGNOSIS — R071 Chest pain on breathing: Secondary | ICD-10-CM

## 2014-06-11 DIAGNOSIS — R0789 Other chest pain: Secondary | ICD-10-CM

## 2014-06-11 DIAGNOSIS — S20219A Contusion of unspecified front wall of thorax, initial encounter: Secondary | ICD-10-CM

## 2014-06-11 MED ORDER — HYDROCODONE-ACETAMINOPHEN 5-325 MG PO TABS
10.0000 | ORAL_TABLET | Freq: Four times a day (QID) | ORAL | Status: DC | PRN
Start: 2014-06-11 — End: 2014-07-14

## 2014-06-11 MED ORDER — MELOXICAM 15 MG PO TABS: 15.0000 mg | ORAL_TABLET | Freq: Every day | ORAL | Status: DC
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
## 2014-07-14 ENCOUNTER — Ambulatory Visit (INDEPENDENT_AMBULATORY_CARE_PROVIDER_SITE_OTHER): Payer: Managed Care, Other (non HMO) | Admitting: Internal Medicine

## 2014-07-14 ENCOUNTER — Encounter: Payer: Self-pay | Admitting: Internal Medicine

## 2014-07-14 VITALS — BP 122/76 | HR 97 | Temp 98.8°F | Ht 68.0 in | Wt 179.6 lb

## 2014-07-14 DIAGNOSIS — J449 Chronic obstructive pulmonary disease, unspecified: Secondary | ICD-10-CM
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
## 2014-07-14 NOTE — Patient Instructions (Addendum)
Prevar should probably be given at your next flu shot  No change in recommendations  Ok to return yearly

## 2014-07-14 NOTE — Assessment & Plan Note (Signed)
-   alpha One AT level 137  01/25/03> genotype   03/23/13 =  MM - PFT's 03/2007 FEV 67% with ratio of 53 and DLC0 98%  - PFT's 03/23/2013  FEV 1.25 (38%) ratio of 40 and 34% p B2 to FEV1 of 51%  and DLCO 97% - PFTs 03/03/2014   FEV 1.29(36%) ratio 48 with 32% better p B2 to FEV1 48% and DLCO 103%  - HFA 100% 12/31/2011  - Singulair started 03/23/13 > > off as of 09/21/13  > worse 11/05/2013 so restarted  - trial of Breo 03/03/2014 > improved 07/14/2014 s cramping    Each maintenance medication was reviewed in detail including most importantly the difference between maintenance and as needed and under what circumstances the prns are to be used.  Please see instructions for details which were reviewed in writing and the patient given a copy.    F/u yearly ok Should probably have prevnar with flu vaccine this fall, discussed but declined for today

## 2014-09-23 ENCOUNTER — Other Ambulatory Visit: Payer: Self-pay

## 2015-01-13 ENCOUNTER — Other Ambulatory Visit: Payer: Self-pay | Admitting: Internal Medicine

## 2015-01-27 ENCOUNTER — Other Ambulatory Visit: Payer: Self-pay | Admitting: Internal Medicine

## 2015-03-04 ENCOUNTER — Other Ambulatory Visit: Payer: Self-pay | Admitting: Internal Medicine

## 2015-06-19 ENCOUNTER — Other Ambulatory Visit: Payer: Self-pay | Admitting: Internal Medicine

## 2015-07-11 ENCOUNTER — Encounter: Payer: Self-pay | Admitting: Internal Medicine

## 2015-07-11 ENCOUNTER — Encounter: Payer: Self-pay | Admitting: Gastroenterology

## 2015-07-31 ENCOUNTER — Other Ambulatory Visit: Payer: Self-pay | Admitting: Internal Medicine

## 2015-08-04 ENCOUNTER — Other Ambulatory Visit: Payer: Self-pay | Admitting: Internal Medicine

## 2015-08-04 ENCOUNTER — Telehealth: Payer: Self-pay | Admitting: Internal Medicine

## 2015-08-04 NOTE — Telephone Encounter (Signed)
Returned pt's call and spoke to his wife Informed her the [name] had already been refilled. Scheduled him for a 1 year follow up on 08/22/15 with MW. She voiced understanding and had no further questions Nothing further needed

## 2015-08-22 ENCOUNTER — Encounter: Payer: Self-pay | Admitting: Internal Medicine

## 2015-08-22 ENCOUNTER — Ambulatory Visit (INDEPENDENT_AMBULATORY_CARE_PROVIDER_SITE_OTHER): Payer: Managed Care, Other (non HMO) | Admitting: Internal Medicine

## 2015-08-22 VITALS — BP 120/84 | HR 77 | Ht 68.0 in | Wt 167.0 lb

## 2015-08-22 DIAGNOSIS — J449 Chronic obstructive pulmonary disease, unspecified: Secondary | ICD-10-CM | POA: Diagnosis not present

## 2015-08-22 NOTE — Assessment & Plan Note (Addendum)
-   alpha One AT level 137  01/25/03> genotype   03/23/13 =  MM - PFT's 03/2007 FEV 67% with ratio of 53 and DLC0 98%  - PFT's 03/23/2013  FEV 1.25 (38%) ratio of 40 and 34% p B2 to FEV1 of 51%  and DLCO 97% - PFTs 03/03/2014   FEV 1.29(36%) ratio 48 with 32% better p B2 to FEV1 48% and DLCO 103%  - HFA 100% 12/31/2011  - Singulair started 03/23/13 > > off as of 09/21/13  > worse 11/05/2013 so restarted  - trial of Breo 03/03/2014 > improved 07/14/2014 s cramping > f/u yearly    I had an extended discussion with the patient reviewing all relevant studies completed to date and  lasting 10 to 15 minutes of a 15 minute visit   Ok to try off spiriva since has been so impressed with BREO and has more of an asthmatic phenotype anyway  rec prevnar /flu but declined today   Each maintenance medication was reviewed in detail including most importantly the difference between maintenance and prns and under what circumstances the prns are to be triggered using an action plan format that is not reflected in the computer generated alphabetically organized AVS.    Please see instructions for details which were reviewed in writing and the patient given a copy highlighting the part that I personally wrote and discussed at today's ov.
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
## 2015-08-22 NOTE — Patient Instructions (Addendum)
Try off spiriva to see what difference if any it makes in your ex capacity  I recommend the Prevar 13 for pneuomonia now and the flu vaccine but will leave that up to you and your primary care doctor  Please schedule a follow up visit in 12 months but call sooner if needed with pfts on return

## 2015-12-12 ENCOUNTER — Other Ambulatory Visit: Payer: Self-pay | Admitting: Internal Medicine

## 2016-03-05 ENCOUNTER — Other Ambulatory Visit: Payer: Self-pay | Admitting: Internal Medicine

## 2016-07-09 ENCOUNTER — Ambulatory Visit (INDEPENDENT_AMBULATORY_CARE_PROVIDER_SITE_OTHER): Payer: Managed Care, Other (non HMO) | Admitting: Physician Assistant

## 2016-07-09 VITALS — BP 122/78 | HR 88 | Temp 99.1°F | Resp 17 | Ht 68.5 in | Wt 172.0 lb

## 2016-07-09 DIAGNOSIS — H5789 Other specified disorders of eye and adnexa: Secondary | ICD-10-CM

## 2016-07-09 DIAGNOSIS — H578 Other specified disorders of eye and adnexa: Secondary | ICD-10-CM | POA: Diagnosis not present

## 2016-07-09 DIAGNOSIS — H04201 Unspecified epiphora, right lacrimal gland: Secondary | ICD-10-CM

## 2016-07-09 MED ORDER — DOXYCYCLINE HYCLATE 100 MG PO CAPS: 100.0000 mg | ORAL_CAPSULE | Freq: Two times a day (BID) | ORAL | 0 refills | Status: AC

## 2016-07-09 NOTE — Progress Notes (Signed)
07/09/2016 2:59 PM   DOB: 1956/11/09 / MRN: [id number]  SUBJECTIVE:  Jared [name] is a 60 y.o. male presenting for "blocked tear duct." Reports this started a few months back and has been a very mild irritation that he has been "just dealing with."  Reports over the last few days the he has developed increased tearing and now is having pain about the right bridge of the nose as well as a mild right sided HA.  He denies frank eye pain, changes in vision, a foreign body sensation, photophobia. He has been told by his optometrist in the past that he has problems with his meibomian glands.     He is allergic to aspirin; bupropion hcl; and penicillins.   He  has a past medical history of COPD (chronic obstructive pulmonary disease) (HCC) and Rhinitis.    He  reports that he quit smoking about 13 years ago. His smoking use included Cigarettes. He has a 40.00 pack-year smoking history. He has never used smokeless tobacco. He reports that he does not drink alcohol or use drugs. He  has no sexual activity history on file. The patient  has no past surgical history on file.  His family history includes Emphysema in his mother; Lung cancer in his mother.  Review of Systems  Constitutional: Negative for chills and fever.  Eyes: Positive for discharge. Negative for blurred vision, double vision, photophobia, pain and redness.  Skin: Negative for rash.  Neurological: Negative for dizziness.    The problem list and medications were reviewed and updated by myself where necessary and exist elsewhere in the encounter.   OBJECTIVE:  BP 122/78 (BP Location: Right Arm, Patient Position: Sitting, Cuff Size: Normal)   Pulse 88   Temp 99.1 F (37.3 C) (Oral)   Resp 17   Ht 5' 8.5" (1.74 m)   Wt 172 lb (78 kg)   SpO2 99%   BMI 25.77 kg/m   Physical Exam  Constitutional: He appears well-developed and well-nourished. No distress.  Eyes: EOM are normal. Pupils are equal, round, and reactive to light.  Right eye exhibits discharge (clear). Right eye exhibits no exudate. No foreign body present in the right eye. Left eye exhibits no exudate. No foreign body present in the left eye. Right conjunctiva is not injected. Right conjunctiva has no hemorrhage. Left conjunctiva is not injected. Left conjunctiva has no hemorrhage.    Cardiovascular: Normal rate.   Pulmonary/Chest: Effort normal.  Skin: He is not diaphoretic.     Visual Acuity Screening   Right eye Left eye Both eyes  Without correction:     With correction: 20/20 20/20 20/20      No results found for this or any previous visit (from the past 72 hour(s)).  No results found.  ASSESSMENT AND PLAN  Jared [name] was seen today for eye drainage.  Diagnoses and all orders for this visit:  Eye tearing, right: His HPI and exam are reassuring for a frank eye problem. Given his complaints of excessive tearing, new mild HA and the finding of a tiny chalazion on the his right lower lid I am starting him on doxycycline.  He will call in 2-3 days if he is not better so that I can send him to Dr. Laruth Bouchard office for a more complete evaluation.  -     doxycycline (VIBRAMYCIN) 100 MG capsule; Take 1 capsule (100 mg total) by mouth 2 (two) times daily.  Irritation of right eye -     doxycycline (  VIBRAMYCIN) 100 MG capsule; Take 1 capsule (100 mg total) by mouth 2 (two) times daily.    The patient is advised to call or return to clinic if he does not see an improvement in symptoms, or to seek the care of the closest emergency department if he worsens with the above plan.   Deliah Boston, MHS, PA-C [facility] 07/09/2016 2:59 PM

## 2016-07-09 NOTE — Patient Instructions (Addendum)
  If you are not better in a few days please call me so I can send you to Dr. Dione Booze.     IF you received an x-ray today, you will receive an invoice from [facility]. Please contact [facility] at [phone] with questions or concerns regarding your invoice.   IF you received labwork today, you will receive an invoice from [employer]. Please contact [facility] at [phone] with questions or concerns regarding your invoice.   Our billing staff will not be able to assist you with questions regarding bills from these companies.  You will be contacted with the lab results as soon as they are available. The fastest way to get your results is to activate your My Chart account. Instructions are located on the last page of this paperwork. If you have not heard from [place] regarding the results in 2 weeks, please contact this office.

## 2016-08-19 ENCOUNTER — Ambulatory Visit (INDEPENDENT_AMBULATORY_CARE_PROVIDER_SITE_OTHER): Payer: Managed Care, Other (non HMO) | Admitting: Physician Assistant

## 2016-08-19 VITALS — BP 122/72 | HR 91 | Temp 99.5°F | Resp 17 | Ht 68.0 in | Wt 174.0 lb

## 2016-08-19 DIAGNOSIS — J029 Acute pharyngitis, unspecified: Secondary | ICD-10-CM | POA: Diagnosis not present

## 2016-08-19 LAB — POCT RAPID STREP A (OFFICE): RAPID STREP A SCREEN: NEGATIVE

## 2016-08-19 NOTE — Progress Notes (Signed)
Patient ID: Jared GraffKevin Saadeh, male    DOB: 01/10/1956, 60 y.o.   MRN: [id number]  PCP: Tally DueGUEST, CHRIS WARREN, MD  Subjective:   Chief Complaint  Patient presents with  . Sore Throat  . Headache    HPI Presents for evaluation of sore throat and headache x several days. He is accompanied by his wife.  He notes that the symptoms are worse later in the day, but overall thinks he's improving some today. Has been self-treating with ibuprofen and warm liquids. Increased throat pain with swallowing, feeling hot and sweaty at night and generally fatigued, achy.  No cough, ear pain, sinus pressure. No fever, chills, GI or GU symptoms. No CP, SOB, dizziness. No known sick contacts.    Review of Systems As above    Patient Active Problem List   Diagnosis Date Noted  . RHINITIS 10/27/2007  . COPD GOLD II with reversibility  10/27/2007     Prior to Admission medications   Medication Sig Start Date End Date Taking? Authorizing Provider  BREO ELLIPTA 100-25 MCG/INH AEPB INHALE 1 PUFF INTO THE LUNGS EVERY MORNING 03/05/16  Yes Nyoka CowdenMichael B Wert, MD  PROAIR HFA 108 [phone](90 Base) MCG/ACT inhaler inhale 2 puffs by mouth INTO THE LUNGS every 4 hours if needed for wheezing 12/13/15  Yes Nyoka CowdenMichael B Wert, MD     Allergies  Allergen Reactions  . Aspirin   . Bupropion Hcl   . Penicillins Hives       Objective:  Physical Exam  Constitutional: He is oriented to person, place, and time. He appears well-developed and well-nourished. He is active and cooperative. No distress.  BP 122/72 (BP Location: Right Arm, Patient Position: Sitting, Cuff Size: Normal)   Pulse 91   Temp 99.5 F (37.5 C) (Oral)   Resp 17   Ht 5\' 8"  (1.727 m)   Wt 174 lb (78.9 kg)   SpO2 98%   BMI 26.46 kg/m   HENT:  Head: Normocephalic and atraumatic.  Right Ear: Hearing, tympanic membrane, external ear and ear canal normal.  Left Ear: Hearing, tympanic membrane, external ear and ear canal normal.  Nose: Nose normal.    Mouth/Throat: Uvula is midline and mucous membranes are normal. Normal dentition. Posterior oropharyngeal erythema (mild) present. No oropharyngeal exudate, posterior oropharyngeal edema or tonsillar abscesses.  Eyes: Conjunctivae are normal. No scleral icterus.  Neck: Normal range of motion, full passive range of motion without pain and phonation normal. Neck supple. No thyromegaly present.  Cardiovascular: Normal rate, regular rhythm and normal heart sounds.   Pulses:      Radial pulses are 2+ on the right side, and 2+ on the left side.  Pulmonary/Chest: Effort normal and breath sounds normal.  Lymphadenopathy:       Head (right side): Tonsillar (non-tender) adenopathy present. No preauricular, no posterior auricular and no occipital adenopathy present.       Head (left side): Tonsillar (non-tender) adenopathy present. No preauricular, no posterior auricular and no occipital adenopathy present.    He has no cervical adenopathy.       Right: No supraclavicular adenopathy present.       Left: No supraclavicular adenopathy present.  Neurological: He is alert and oriented to person, place, and time. No sensory deficit.  Skin: Skin is warm, dry and intact. No rash noted. No cyanosis or erythema. Nails show no clubbing.  Psychiatric: He has a normal mood and affect. His speech is normal and behavior is normal.  Results for orders placed or performed in visit on 08/19/16  POCT rapid strep A  Result Value Ref Range   Rapid Strep A Screen Negative Negative       Assessment & Plan:   1. Sore throat Supportive care. Await TCx. Anticipatory guidance provided. RTC if symptoms worsen/persist. - POCT rapid strep A - Culture, Group A Strep   Fernande Bras, PA-C Physician Assistant-Certified [facility]

## 2016-08-19 NOTE — Patient Instructions (Addendum)
Get plenty of rest and drink at least 64 ounces of water daily.     IF you received an x-ray today, you will receive an invoice from [facility]. Please contact [facility] at [phone] with questions or concerns regarding your invoice.   IF you received labwork today, you will receive an invoice from [facility]. Please contact [facility] at [phone] with questions or concerns regarding your invoice.   Our billing staff will not be able to assist you with questions regarding bills from these companies.  You will be contacted with the lab results as soon as they are available. The fastest way to get your results is to activate your My Chart account. Instructions are located on the last page of this paperwork. If you have not heard from us regarding the results in 2 weeks, please contact this office.     

## 2016-08-20 NOTE — Progress Notes (Signed)
   Subjective:    Patient ID: Jared [name], male    DOB: 08/05/1956, 60 y.o.   MRN: [id number] Chief Complaint  Patient presents with  . Sore Throat  . Headache   HPI Patient is a 60 yo male with a hx of COPD, well controlled on medication, who presents today with a sore throat and HA x 4 days. His sore throat gets worse as the day progresses and is relieved with ibuprofen and warm liquids. He describes minor swelling in throat with the pain. Patient admits to HA, dysphagia, nocturnal diaphoresis and general malaise. Denies fever, cough, difficulty breathing, heart palpitations, and sick contacts.    Review of Systems All review of systems are negative except for those stated above.     Objective:   Physical Exam  Constitutional: He appears well-developed and well-nourished. No distress.  HENT:  Head: Normocephalic and atraumatic.  Mouth/Throat:    Eyes: Conjunctivae are normal. Right eye exhibits no discharge. Left eye exhibits no discharge.  Neck: Normal range of motion. Neck supple.  Cardiovascular: Normal rate, regular rhythm, normal heart sounds and intact distal pulses.  Exam reveals no gallop and no friction rub.   No murmur heard. Pulmonary/Chest: Effort normal. No respiratory distress.  Slight crackles heard over left lower lung field  Abdominal: Soft. Bowel sounds are normal. He exhibits no distension.  Lymphadenopathy:    He has no cervical adenopathy.  Skin: Skin is warm and dry.          Assessment & Plan:  1. Sore throat Most likely viral process. Symptoms have been present for 4 days however patient reports he is feeling better than previous days. Supportive care and anticipatory guidance provided.  - POCT rapid strep A - Culture, Group A Strep

## 2016-08-21 LAB — CULTURE, GROUP A STREP: Organism ID, Bacteria: NORMAL

## 2016-08-26 ENCOUNTER — Other Ambulatory Visit: Payer: Self-pay | Admitting: Internal Medicine

## 2016-08-26 DIAGNOSIS — R0600 Dyspnea, unspecified: Secondary | ICD-10-CM

## 2016-08-27 ENCOUNTER — Ambulatory Visit (INDEPENDENT_AMBULATORY_CARE_PROVIDER_SITE_OTHER): Payer: Managed Care, Other (non HMO) | Admitting: Internal Medicine

## 2016-08-27 ENCOUNTER — Encounter: Payer: Self-pay | Admitting: Internal Medicine

## 2016-08-27 ENCOUNTER — Encounter (INDEPENDENT_AMBULATORY_CARE_PROVIDER_SITE_OTHER): Payer: Managed Care, Other (non HMO) | Admitting: Internal Medicine

## 2016-08-27 VITALS — BP 130/80 | HR 90 | Ht 68.0 in | Wt 169.0 lb

## 2016-08-27 DIAGNOSIS — J449 Chronic obstructive pulmonary disease, unspecified: Secondary | ICD-10-CM | POA: Diagnosis not present

## 2016-08-27 DIAGNOSIS — R0600 Dyspnea, unspecified: Secondary | ICD-10-CM | POA: Diagnosis not present

## 2016-08-27 LAB — PULMONARY FUNCTION TEST
DL/VA % PRED: 121 %
DL/VA: 5.48 ml/min/mmHg/L
DLCO UNC % PRED: 95 %
DLCO cor % pred: 101 %
DLCO cor: 30.23 ml/min/mmHg
DLCO unc: 28.27 ml/min/mmHg
FEF 25-75 PRE: 0.55 L/s
FEF 25-75 Post: 0.56 L/sec
FEF2575-%CHANGE-POST: 2 %
FEF2575-%PRED-PRE: 18 %
FEF2575-%Pred-Post: 19 %
FEV1-%Change-Post: 4 %
FEV1-%PRED-PRE: 37 %
FEV1-%Pred-Post: 38 %
FEV1-POST: 1.32 L
FEV1-PRE: 1.27 L
FEV1FVC-%CHANGE-POST: 3 %
FEV1FVC-%Pred-Pre: 63 %
FEV6-%CHANGE-POST: 0 %
FEV6-%PRED-PRE: 60 %
FEV6-%Pred-Post: 60 %
FEV6-POST: 2.6 L
FEV6-PRE: 2.59 L
FEV6FVC-%Change-Post: 0 %
FEV6FVC-%PRED-POST: 103 %
FEV6FVC-%PRED-PRE: 103 %
FVC-%Change-Post: 0 %
FVC-%PRED-POST: 58 %
FVC-%Pred-Pre: 58 %
FVC-POST: 2.65 L
FVC-Pre: 2.64 L
POST FEV6/FVC RATIO: 98 %
PRE FEV1/FVC RATIO: 48 %
Post FEV1/FVC ratio: 50 %
Pre FEV6/FVC Ratio: 98 %
RV % PRED: 168 %
RV: 3.52 L
TLC % PRED: 99 %
TLC: 6.54 L

## 2016-08-27 NOTE — Assessment & Plan Note (Signed)
-   alpha One AT level 137  01/25/03> genotype   03/23/13 =  MM - PFT's 03/2007 FEV 67% with ratio of 53 and DLC0 98%  - PFT's 03/23/2013  FEV 1.25 (38%) ratio of 40 and 34% p B2 to FEV1 of 51%  and DLCO 97% - PFTs 03/03/2014   FEV 1.29(36%) ratio 48 with 32% better p B2 to FEV1 48% and DLCO 103%  - HFA 100% 12/31/2011  - Singulair started 03/23/13 > > off as of 09/21/13  > worse 11/05/2013 so restarted  - trial of Breo 03/03/2014 > improved 07/14/2014 s cramping > f/u yearly  - 08/22/2015 trial off spiriva > no worse  - PFT's  08/27/2016  FEV1 1.32  (38 % ) ratio 50  p 4 % improvement from saba p Breo 100 prior to study with DLCO  96/101 % corrects to 121 % for alv volume     Acting more like severe chronic asthma than copd esp with dlco remaining nl and no worse off spiriva but still a GOLD III severity even on BREO with a component of upper airway wheezing/ hoarseness likely to be worsened by the higher dose of BREO so rec no change rx for now  Advised more reg ex and primary care f/u for health maintenance  I had an extended discussion with the patient reviewing all relevant studies completed to date and  lasting 15 to 20 minutes of a 25 minute visit    Each maintenance medication was reviewed in detail including most importantly the difference between maintenance and prns and under what circumstances the prns are to be triggered using an action plan format that is not reflected in the computer generated alphabetically organized AVS.    Please see instructions for details which were reviewed in writing and the patient given a copy highlighting the part that I personally wrote and discussed at today's ov.
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
## 2016-08-27 NOTE — Patient Instructions (Addendum)
Be sure to keep your primary care appointments   Please schedule a follow up visit in 12 months but call sooner if needed

## 2016-09-01 ENCOUNTER — Other Ambulatory Visit: Payer: Self-pay | Admitting: Internal Medicine

## 2016-09-26 ENCOUNTER — Ambulatory Visit (INDEPENDENT_AMBULATORY_CARE_PROVIDER_SITE_OTHER): Payer: Managed Care, Other (non HMO)

## 2016-09-26 ENCOUNTER — Ambulatory Visit (INDEPENDENT_AMBULATORY_CARE_PROVIDER_SITE_OTHER): Payer: Managed Care, Other (non HMO) | Admitting: Physician Assistant

## 2016-09-26 VITALS — BP 92/66 | HR 108 | Temp 99.4°F | Resp 17 | Ht 68.0 in | Wt 172.0 lb

## 2016-09-26 DIAGNOSIS — R05 Cough: Secondary | ICD-10-CM

## 2016-09-26 DIAGNOSIS — J45909 Unspecified asthma, uncomplicated: Secondary | ICD-10-CM | POA: Diagnosis not present

## 2016-09-26 DIAGNOSIS — R509 Fever, unspecified: Secondary | ICD-10-CM

## 2016-09-26 DIAGNOSIS — R059 Cough, unspecified: Secondary | ICD-10-CM

## 2016-09-26 LAB — POCT CBC
Granulocyte percent: 84.6 %G — AB (ref 37–80)
HCT, POC: 42.2 % — AB (ref 43.5–53.7)
Hemoglobin: 15 g/dL (ref 14.1–18.1)
Lymph, poc: 0.5 — AB (ref 0.6–3.4)
MCH, POC: 34.3 pg — AB (ref 27–31.2)
MCHC: 35.5 g/dL — AB (ref 31.8–35.4)
MCV: 96.5 fL (ref 80–97)
MID (cbc): 0.4 (ref 0–0.9)
MPV: 7.1 fL (ref 0–99.8)
POC Granulocyte: 4.8 (ref 2–6.9)
POC LYMPH PERCENT: 8.8 % — AB (ref 10–50)
POC MID %: 6.6 % (ref 0–12)
Platelet Count, POC: 190 10*3/uL (ref 142–424)
RBC: 4.37 M/uL — AB (ref 4.69–6.13)
RDW, POC: 13.1 %
WBC: 5.7 10*3/uL (ref 4.6–10.2)

## 2016-09-26 LAB — POCT INFLUENZA A/B
Influenza A, POC: NEGATIVE
Influenza B, POC: NEGATIVE

## 2016-09-26 MED ORDER — ALBUTEROL SULFATE (2.5 MG/3ML) 0.083% IN NEBU: 2.5000 mg | INHALATION_SOLUTION | Freq: Four times a day (QID) | RESPIRATORY_TRACT | 1 refills | Status: DC | PRN

## 2016-09-26 MED ORDER — BENZONATATE 100 MG PO CAPS: 100.0000 mg | ORAL_CAPSULE | Freq: Three times a day (TID) | ORAL | 0 refills | Status: DC | PRN

## 2016-09-26 MED ORDER — MUCINEX DM MAXIMUM STRENGTH 60-1200 MG PO TB12: 1.0000 | ORAL_TABLET | Freq: Two times a day (BID) | ORAL | 0 refills | Status: DC

## 2016-09-26 NOTE — Progress Notes (Signed)
Jared GraffKevin [name]  MRN: [id number] DOB: 03/21/1956  PCP: No primary care provider on file.  Subjective:  Pt history of COPD and asthma, who presents to clinic for cough and fever x two days. He felt a scratchy throat  A few days ago and now has progressed to non-productive cough, however he feels like he "needs to cough stuff up".   Reports 102.5 fever this morning. Took advil, helped. Has not taken anything else for his cough. No known sick contacts.  Denies Cp, SOB, wheezing, headache, nausea, vomiting, sore throat.  Used his routine daily inhaler this morning, had to use emergency inhaler. Of note, he often gets sick around this time of year. States using his humidifier helps, but has not started it yet this season.   Review of Systems  Constitutional: Positive for fever. Negative for chills and fatigue.  HENT: Negative for congestion, postnasal drip, rhinorrhea, sneezing, sore throat and voice change.   Respiratory: Positive for cough. Negative for chest tightness, shortness of breath and wheezing.   Cardiovascular: Negative for chest pain and palpitations.    Patient Active Problem List   Diagnosis Date Noted  . RHINITIS 10/27/2007  . COPD GOLD II with reversibility  10/27/2007    Current Outpatient Prescriptions on File Prior to Visit  Medication Sig Dispense Refill  . BREO ELLIPTA 100-25 MCG/INH AEPB INHALE 1 PUFF INTO THE LUNGS EVERY MORNING 60 each 11  . PROAIR HFA 108 (90 Base) MCG/ACT inhaler inhale 2 puffs by mouth INTO THE LUNGS every 4 hours if needed for wheezing 8.5 Inhaler 11   No current facility-administered medications on file prior to visit.     Allergies  Allergen Reactions  . Aspirin   . Bupropion Hcl   . Penicillins Hives    Objective:  BP 92/66 (BP Location: Right Arm, Patient Position: Sitting, Cuff Size: Normal)   Pulse (!) 108   Temp 99.4 F (37.4 C) (Oral)   Resp 17   Ht 5\' 8"  (1.727 m)   Wt 172 lb (78 kg)   SpO2 96%   BMI 26.15 kg/m    Physical Exam  Constitutional: He is oriented to person, place, and time and well-developed, well-nourished, and in no distress. No distress.  Cardiovascular: Normal rate, regular rhythm and normal heart sounds.   Pulmonary/Chest: Effort normal and breath sounds normal. No respiratory distress. He has no wheezes. He has no rales.  Neurological: He is alert and oriented to person, place, and time. GCS score is 15.  Skin: Skin is warm and dry.  Psychiatric: Mood, memory, affect and judgment normal.  Vitals reviewed.   Results for orders placed or performed in visit on 09/26/16  POCT Influenza A/B  Result Value Ref Range   Influenza A, POC Negative Negative   Influenza B, POC Negative Negative  POCT CBC  Result Value Ref Range   WBC 5.7 4.6 - 10.2 K/uL   Lymph, poc 0.5 (A) 0.6 - 3.4   POC LYMPH PERCENT 8.8 (A) 10 - 50 %L   MID (cbc) 0.4 0 - 0.9   POC MID % 6.6 0 - 12 %M   POC Granulocyte 4.8 2 - 6.9   Granulocyte percent 84.6 (A) 37 - 80 %G   RBC 4.37 (A) 4.69 - 6.13 M/uL   Hemoglobin 15.0 14.1 - 18.1 g/dL   HCT, POC 40.942.2 (A) 81.143.5 - 53.7 %   MCV 96.5 80 - 97 fL   MCH, POC 34.3 (A) 27 - 31.2 pg  MCHC 35.5 (A) 31.8 - 35.4 g/dL   RDW, POC 82.9 %   Platelet Count, POC 190 142 - 424 K/uL   MPV 7.1 0 - 99.8 fL   Dg Chest 2 View  Result Date: 09/26/2016 CLINICAL DATA:  Cough, history of COPD and asthma EXAM: CHEST  2 VIEW COMPARISON:  Chest x-ray of 06/11/2014 and 02/11/2014 FINDINGS: The lungs are clear but remain somewhat hyperaerated. No pneumonia or effusion is seen. Minimal peribronchial thickening is present which may indicate bronchitis. Mediastinal and hilar contours are unremarkable. The heart is within normal limits in size. No bony abnormality is seen. IMPRESSION: Slight hyper aeration. No pneumonia or effusion. Question bronchitis. Electronically Signed   By: Dwyane Dee M.D.   On: 09/26/2016 11:47    Assessment and Plan :  1. Cough 2. Fever, unspecified fever  cause - POCT Influenza A/B - POCT CBC - DG Chest 2 View; Future - Dextromethorphan-Guaifenesin (MUCINEX DM MAXIMUM STRENGTH) 60-1200 MG TB12; Take 1 tablet by mouth every 12 (twelve) hours.  Dispense: 14 each; Refill: 0 - benzonatate (TESSALON) 100 MG capsule; Take 1-2 capsules (100-200 mg total) by mouth 3 (three) times daily as needed for cough.  Dispense: 40 capsule; Refill: 0  3. Asthma, unspecified asthma severity, unspecified whether complicated, unspecified whether persistent - albuterol (PROVENTIL) (2.5 MG/3ML) 0.083% nebulizer solution; Take 3 mLs (2.5 mg total) by nebulization every 6 (six) hours as needed for wheezing or shortness of breath.  Dispense: 150 mL; Refill: 1 - Supportive care discussed, encouraged to use humidifier at home. RTC if symptoms do not improve/worsen in 5-7 days.   Marco Collie, PA-C  [facility] 09/26/2016 10:52 AM

## 2016-09-26 NOTE — Patient Instructions (Addendum)
Please start your humidifier at home, as this will help relieve your symptoms. Breathing in the steam from hot showers will also help.  Stay well hydrated and get plenty of rest.  Mucinex will help you clear your lungs of phlegm. Please take this as directed.  Use the Tessalon pearls as needed for cough.    IF you received an x-ray today, you will receive an invoice from [facility]. Please contact [facility] at [phone] with questions or concerns regarding your invoice.   IF you received labwork today, you will receive an invoice from [facility]. Please contact [facility] at [phone] with questions or concerns regarding your invoice.   Our billing staff will not be able to assist you with questions regarding bills from these companies.  You will be contacted with the lab results as soon as they are available. The fastest way to get your results is to activate your My Chart account. Instructions are located on the last page of this paperwork. If you have not heard from us regarding the results in 2 weeks, please contact this office.     Acute Bronchitis Bronchitis is inflammation of the airways that extend from the windpipe into the lungs (bronchi). The inflammation often causes mucus to develop. This leads to a cough, which is the most common symptom of bronchitis.  In acute bronchitis, the condition usually develops suddenly and goes away over time, usually in a couple weeks. Smoking, allergies, and asthma can make bronchitis worse. Repeated episodes of bronchitis may cause further lung problems.  CAUSES Acute bronchitis is most often caused by the same virus that causes a cold. The virus can spread from person to person (contagious) through coughing, sneezing, and touching contaminated objects. SIGNS AND SYMPTOMS   Cough.   Fever.   Coughing up mucus.   Body aches.   Chest congestion.   Chills.   Shortness of breath.    Sore throat.  DIAGNOSIS  Acute bronchitis is usually diagnosed through a physical exam. Your health care provider will also ask you questions about your medical history. Tests, such as chest X-rays, are sometimes done to rule out other conditions.  TREATMENT  Acute bronchitis usually goes away in a couple weeks. Oftentimes, no medical treatment is necessary. Medicines are sometimes given for relief of fever or cough. Antibiotic medicines are usually not needed but may be prescribed in certain situations. In some cases, an inhaler may be recommended to help reduce shortness of breath and control the cough. A cool mist vaporizer may also be used to help thin bronchial secretions and make it easier to clear the chest.  HOME CARE INSTRUCTIONS  Get plenty of rest.   Drink enough fluids to keep your urine clear or pale yellow (unless you have a medical condition that requires fluid restriction). Increasing fluids may help thin your respiratory secretions (sputum) and reduce chest congestion, and it will prevent dehydration.   Take medicines only as directed by your health care provider.  If you were prescribed an antibiotic medicine, finish it all even if you start to feel better.  Avoid smoking and secondhand smoke. Exposure to cigarette smoke or irritating chemicals will make bronchitis worse. If you are a smoker, consider using nicotine gum or skin patches to help control withdrawal symptoms. Quitting smoking will help your lungs heal faster.   Reduce the chances of another bout of acute bronchitis by washing your hands frequently, avoiding people with cold symptoms, and trying not to touch  your hands to your mouth, nose, or eyes.   Keep all follow-up visits as directed by your health care provider.  SEEK MEDICAL CARE IF: Your symptoms do not improve after 1 week of treatment.  SEEK IMMEDIATE MEDICAL CARE IF:  You develop an increased fever or chills.   You have chest pain.    You have severe shortness of breath.  You have bloody sputum.   You develop dehydration.  You faint or repeatedly feel like you are going to pass out.  You develop repeated vomiting.  You develop a severe headache. MAKE SURE YOU:   Understand these instructions.  Will watch your condition.  Will get help right away if you are not doing well or get worse.   This information is not intended to replace advice given to you by your health care provider. Make sure you discuss any questions you have with your health care provider.   Document Released: 01/02/2005 Document Revised: 12/16/2014 Document Reviewed: 05/18/2013 Elsevier Interactive Patient Education [employer]

## 2016-09-27 ENCOUNTER — Telehealth: Payer: Self-pay

## 2016-09-27 DIAGNOSIS — R05 Cough: Secondary | ICD-10-CM

## 2016-09-27 DIAGNOSIS — R059 Cough, unspecified: Secondary | ICD-10-CM

## 2016-09-27 MED ORDER — PREDNISONE 20 MG PO TABS: ORAL_TABLET | ORAL | 0 refills | Status: DC

## 2016-09-27 NOTE — Telephone Encounter (Signed)
Wife called back to check status of req. She just is concerned because they have been through this before with his COPD and it is on the way to becoming worse. Wife stated that so far his O2 Sats have been over 90% and he has a nebulizer that he can use if his rescue inhaler if not effective. Wife reports that it normally takes prednisone to get breathing back under control when he gets sick like this and wants to get ahead of it before he gets even sicker. Whitney please advise..Marland Kitchen

## 2016-09-27 NOTE — Telephone Encounter (Signed)
Send prescription for prednisone to his pharmacy. Please let patient know. Thank you!

## 2016-09-27 NOTE — Telephone Encounter (Signed)
Patient is requesting a prednisone dose pack because this will help him with his breathing. Wife states he is worse today than he was when he came in on 09/26/16.  [facility] [address]  His call back number is [phone] or [phone] and it is ok to speak to his wife about this matter.

## 2016-09-27 NOTE — Telephone Encounter (Signed)
Notified wife of Rx for pred being sent. Also discussed to take to ED over the weekend if he worsens and O2 Sats drop. Wife agreed.

## 2017-04-07 ENCOUNTER — Telehealth: Payer: Self-pay | Admitting: Internal Medicine

## 2017-04-07 MED ORDER — ALBUTEROL SULFATE HFA 108 (90 BASE) MCG/ACT IN AERS: INHALATION_SPRAY | RESPIRATORY_TRACT | 6 refills | Status: DC

## 2017-04-07 NOTE — Telephone Encounter (Signed)
Refill has been sent to the pharmacy per pts request. I have called and lmom to make the pt aware

## 2017-04-30 ENCOUNTER — Ambulatory Visit (INDEPENDENT_AMBULATORY_CARE_PROVIDER_SITE_OTHER): Payer: Managed Care, Other (non HMO) | Admitting: Family Medicine

## 2017-04-30 ENCOUNTER — Encounter: Payer: Self-pay | Admitting: Family Medicine

## 2017-04-30 VITALS — BP 124/75 | HR 97 | Temp 98.6°F | Resp 16 | Ht 68.0 in | Wt 181.4 lb

## 2017-04-30 DIAGNOSIS — H6593 Unspecified nonsuppurative otitis media, bilateral: Secondary | ICD-10-CM

## 2017-04-30 DIAGNOSIS — H6122 Impacted cerumen, left ear: Secondary | ICD-10-CM | POA: Diagnosis not present

## 2017-04-30 NOTE — Patient Instructions (Addendum)
IF you received an x-ray today, you will receive an invoice from [facility]. Please contact [facility] at [phone] with questions or concerns regarding your invoice.   IF you received labwork today, you will receive an invoice from [place]. Please contact [facility] at [phone] with questions or concerns regarding your invoice.   Our billing staff will not be able to assist you with questions regarding bills from these companies.  You will be contacted with the lab results as soon as they are available. The fastest way to get your results is to activate your My Chart account. Instructions are located on the last page of this paperwork. If you have not heard from [place] regarding the results in 2 weeks, please contact this office.      Eustachian Tube Dysfunction The eustachian tube connects the middle ear to the back of the nose. It regulates air pressure in the middle ear by allowing air to move between the ear and nose. It also helps to drain fluid from the middle ear space. When the eustachian tube does not function properly, air pressure, fluid, or both can build up in the middle ear. Eustachian tube dysfunction can affect one or both ears. What are the causes? This condition happens when the eustachian tube becomes blocked or cannot open normally. This may result from:  Ear infections.  Colds and other upper respiratory infections.  Allergies.  Irritation, such as from cigarette smoke or acid from the stomach coming up into the esophagus (gastroesophageal reflux).  Sudden changes in air pressure, such as from descending in an airplane.  Abnormal growths in the nose or throat, such as nasal polyps, tumors, or enlarged tissue at the back of the throat (adenoids). What increases the risk? This condition may be more likely to develop in people who smoke and people who are overweight. Eustachian tube dysfunction may also be more likely to develop in  children, especially children who have:  Certain birth defects of the mouth, such as cleft palate.  Large tonsils and adenoids. What are the signs or symptoms? Symptoms of this condition may include:  A feeling of fullness in the ear.  Ear pain.  Clicking or popping noises in the ear.  Ringing in the ear.  Hearing loss.  Loss of balance. Symptoms may get worse when the air pressure around you changes, such as when you travel to an area of high elevation or fly on an airplane. How is this diagnosed? This condition may be diagnosed based on:  Your symptoms.  A physical exam of your ear, nose, and throat.  Tests, such as those that measure:  The movement of your eardrum (tympanogram).  Your hearing (audiometry). How is this treated? Treatment depends on the cause and severity of your condition. If your symptoms are mild, you may be able to relieve your symptoms by moving air into ("popping") your ears. If you have symptoms of fluid in your ears, treatment may include:  Decongestants.  Antihistamines.  Nasal sprays or ear drops that contain medicines that reduce swelling (steroids). In some cases, you may need to have a procedure to drain the fluid in your eardrum (myringotomy). In this procedure, a small tube is placed in the eardrum to:  Drain the fluid.  Restore the air in the middle ear space. Follow these instructions at home:  Take over-the-counter and prescription medicines only as told by your health care provider.  Use techniques to help pop your ears as recommended by  your health care provider. These may include:  Chewing gum.  Yawning.  Frequent, forceful swallowing.  Closing your mouth, holding your nose closed, and gently blowing as if you are trying to blow air out of your nose.  Do not do any of the following until your health care provider approves:  Travel to high altitudes.  Fly in airplanes.  Work in a Estate agentpressurized cabin or room.  Scuba  dive.  Keep your ears dry. Dry your ears completely after showering or bathing.  Do not smoke.  Keep all follow-up visits as told by your health care provider. This is important. Contact a health care provider if:  Your symptoms do not go away after treatment.  Your symptoms come back after treatment.  You are unable to pop your ears.  You have:  A fever.  Pain in your ear.  Pain in your head or neck.  Fluid draining from your ear.  Your hearing suddenly changes.  You become very dizzy.  You lose your balance. This information is not intended to replace advice given to you by your health care provider. Make sure you discuss any questions you have with your health care provider. Document Released: 12/22/2015 Document Revised: 05/02/2016 Document Reviewed: 12/14/2014 Elsevier Interactive Patient Education  2017 [employer]

## 2017-04-30 NOTE — Progress Notes (Signed)
Subjective:  By signing my name below, I, Essence Howell, attest that this documentation has been prepared under the direction and in the presence of Norberto Sorenson, MD Electronically Signed: Charline Bills, ED Scribe 04/30/2017 at 12:09 PM.   Patient ID: Jared [name], male    DOB: 04/12/1956, 61 y.o.   MRN: [id number]  Chief Complaint  Patient presents with  . Cerumen Impaction    feels like cotton is in his ear (muffled)   HPI Cache Bills is a 61 y.o. male who presents to Primary Care at [facility] complaining of muffled hearing in the left ear. Pt states that he had been noticing that his left ear had been clogged after getting out of the shower. He had been able to manipulate the pinna to hear again but was unable to hear after manipulation today. Pt does not use Q-tips to clean his ear. He denies issues with recurrent wax, drainage, discharge, pain, allergy symptoms.  Past Medical History:  Diagnosis Date  . COPD (chronic obstructive pulmonary disease) (HCC)   . Rhinitis    Current Outpatient Prescriptions on File Prior to Visit  Medication Sig Dispense Refill  . albuterol (PROAIR HFA) 108 (90 Base) MCG/ACT inhaler inhale 2 puffs by mouth INTO THE LUNGS every 4 hours if needed for wheezing 8 g 6  . albuterol (PROVENTIL) (2.5 MG/3ML) 0.083% nebulizer solution Take 3 mLs (2.5 mg total) by nebulization every 6 (six) hours as needed for wheezing or shortness of breath. 150 mL 1  . BREO ELLIPTA 100-25 MCG/INH AEPB INHALE 1 PUFF INTO THE LUNGS EVERY MORNING 60 each 11  . benzonatate (TESSALON) 100 MG capsule Take 1-2 capsules (100-200 mg total) by mouth 3 (three) times daily as needed for cough. (Patient not taking: Reported on 04/30/2017) 40 capsule 0  . Dextromethorphan-Guaifenesin (MUCINEX DM MAXIMUM STRENGTH) 60-1200 MG TB12 Take 1 tablet by mouth every 12 (twelve) hours. (Patient not taking: Reported on 04/30/2017) 14 each 0  . predniSONE (DELTASONE) 20 MG tablet Take 3 PO QAM x3days, 2 PO  QAM x3days, 1 PO QAM x3days (Patient not taking: Reported on 04/30/2017) 18 tablet 0   No current facility-administered medications on file prior to visit.    Allergies  Allergen Reactions  . Aspirin   . Bupropion Hcl   . Penicillins Hives   Review of Systems  HENT: Negative for ear discharge and ear pain.   Allergic/Immunologic: Negative for environmental allergies.      Objective:   Physical Exam  Constitutional: He is oriented to person, place, and time. He appears well-developed and well-nourished. No distress.  HENT:  Head: Normocephalic and atraumatic.  Right Ear: Tympanic membrane is injected and retracted. A middle ear effusion (small) is present.  L brown cerumen impaction.  Eyes: Conjunctivae and EOM are normal.  Neck: Neck supple. No tracheal deviation present.  Cardiovascular: Normal rate.   Pulmonary/Chest: Effort normal. No respiratory distress.  Musculoskeletal: Normal range of motion.  Neurological: He is alert and oriented to person, place, and time.  Skin: Skin is warm and dry.  Psychiatric: He has a normal mood and affect. His behavior is normal.  Nursing note and vitals reviewed.  BP 124/75 (BP Location: Right Arm, Patient Position: Sitting, Cuff Size: Small)   Pulse 97   Temp 98.6 F (37 C) (Oral)   Resp 16   Ht 5\' 8"  (1.727 m)   Wt 181 lb 6.4 oz (82.3 kg)   SpO2 96%   BMI 27.58 kg/m  Assessment & Plan:   1. Impacted cerumen of left ear   2. Middle ear effusion, bilateral     Orders Placed This Encounter  Procedures  . Ear wax removal  . Care order/instruction:    Scheduling Instructions:     Complete orders, AVS and go. Please pull me quickly to look at TM     I personally performed the services described in this documentation, which was scribed in my presence. The recorded information has been reviewed and considered, and addended by me as needed.   Norberto SorensonEva Mekhia Brogan, M.D.  Primary [facility] at [facility]  [place] [address] [place] [id number] [phone] phone [phone] fax  05/13/17 2:54 AM

## 2017-08-29 ENCOUNTER — Encounter: Payer: Self-pay | Admitting: Internal Medicine

## 2017-08-29 ENCOUNTER — Ambulatory Visit (INDEPENDENT_AMBULATORY_CARE_PROVIDER_SITE_OTHER): Payer: Managed Care, Other (non HMO) | Admitting: Internal Medicine

## 2017-08-29 VITALS — BP 122/70 | HR 65 | Ht 68.0 in | Wt 165.8 lb

## 2017-08-29 DIAGNOSIS — J449 Chronic obstructive pulmonary disease, unspecified: Secondary | ICD-10-CM

## 2017-08-29 NOTE — Patient Instructions (Addendum)
No change in medications  -  If any problem with formulary restrictions, please let me know best alternatives to Breo/Proair to see if you need training on a new delivery device   Please schedule a follow up visit in 12 months but call sooner if needed
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
## 2017-08-29 NOTE — Assessment & Plan Note (Signed)
-   alpha One AT level 137  01/25/03> genotype   03/23/13 =  MM - PFT's 03/2007 FEV 67% with ratio of 53 and DLC0 98%  - PFT's 03/23/2013  FEV 1.25 (38%) ratio of 40 and 34% p B2 to FEV1 of 51%  and DLCO 97% - PFTs 03/03/2014   FEV 1.29(36%) ratio 48 with 32% better p B2 to FEV1 48% and DLCO 103%  - HFA 100% 12/31/2011  - Singulair started 03/23/13 > > off as of 09/21/13  > worse 11/05/2013 so restarted  - trial of Breo 03/03/2014 > improved 07/14/2014 s cramping > f/u yearly  - 08/22/2015 trial off spiriva > no worse  - PFT's  08/27/2016  FEV1 1.32  (38 % ) ratio 50  p 4 % improvement from saba p Breo 100 prior to study with DLCO  96/101 % corrects to 121 % for alv volume     Well compensated on just laba/ics typical of an ACOS pt > no change needed   Each maintenance medication was reviewed in detail including most importantly the difference between maintenance and as needed and under what circumstances the prns are to be used.  Please see AVS for specific  Instructions which are unique to this visit and I personally typed out  which were reviewed in detail in writing with the patient and a copy provided.

## 2017-09-04 ENCOUNTER — Other Ambulatory Visit: Payer: Self-pay | Admitting: Internal Medicine

## 2017-09-10 IMAGING — DX DG CHEST 2V
2 series · 2 of 2 positions shown · non-contrast
Comparison: Chest x-ray of 06/11/2014 and 02/11/2014

CLINICAL DATA: Cough, history of COPD and asthma

EXAM:
CHEST  2 VIEW

[chest pa]
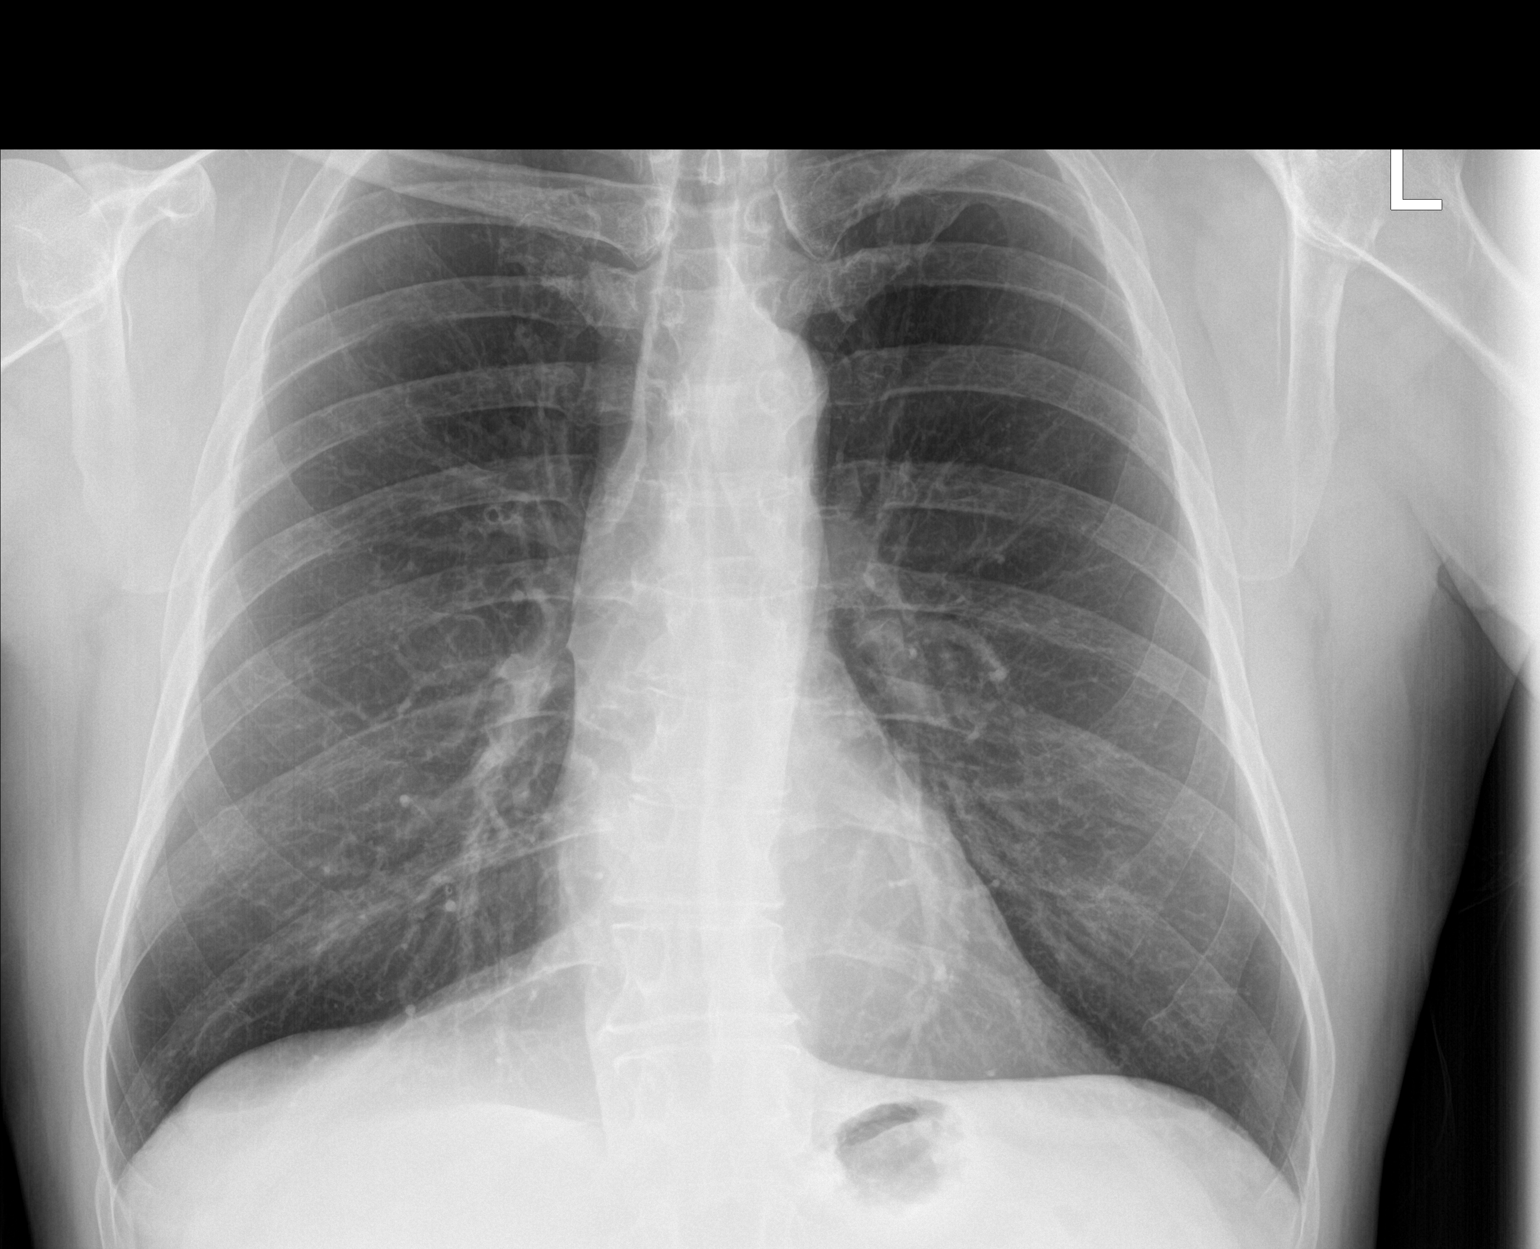

[chest lat]
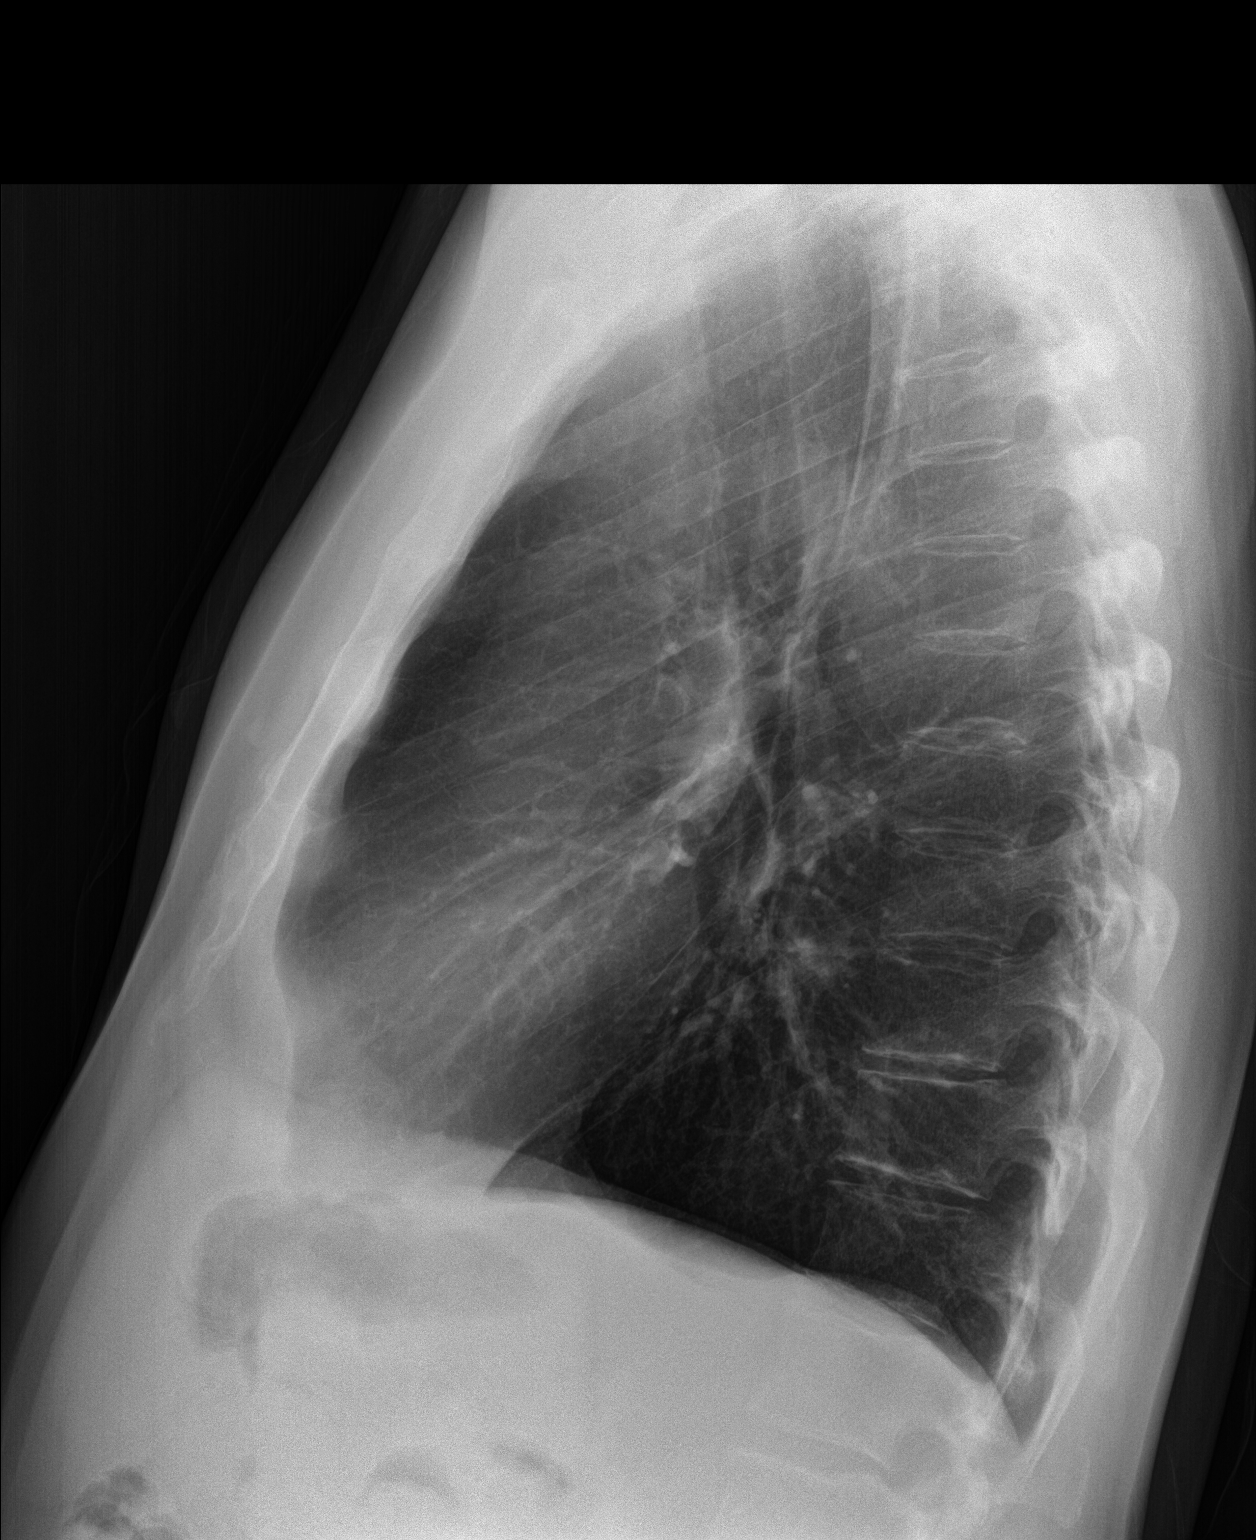

[2 of 2 positions shown; findings below may reference images not displayed]

FINDINGS: The lungs are clear but remain somewhat hyperaerated. No pneumonia
or effusion is seen. Minimal peribronchial thickening is present
which may indicate bronchitis. Mediastinal and hilar contours are
unremarkable. The heart is within normal limits in size. No bony
abnormality is seen.
IMPRESSION: Slight hyper aeration. No pneumonia or effusion. Question
bronchitis.

## 2017-10-04 NOTE — Progress Notes (Signed)
[facility] at [facility] [address] [place], [place] [address] [phone]  Date:  10/06/2017   Name:  Jared [name]   DOB:  02/23/1956   MRN:  [id number]  PCP:  Patient, No Pcp Per    Chief Complaint: Establish Care (Subcu bump on penis. Pt states he saw it about 3 months ago. No pain, no redness, wants provider to see. States no change since he noticed it. )   History of Present Illness:  Jared [name] is a 61 y.o. very pleasant male patient who presents with the following:  Here today as a new patient- I did see him a few years ago at [facility] History of COPD and asthma Dr. Sherene Sires is his pulmonolgist   Flu: declines today Tetanus: will do today Colon:  2007- due.  However he would like to consider  Labs:  He is due. Not fasting but would like to go ahead and do this   He has noted a small bump on the base of his penis for about 3 months- it does not seem to be getting larger and is not painful at all.  He thought it was probably ok but wanted [place] to check it for him.  No other symptoms associated.   He has been on high protein diet the last 90 days and lost 20 lbs  Here today with his wife [name]  Wt Readings from Last 3 Encounters:  10/06/17 165 lb (74.8 kg)  08/29/17 165 lb 12.8 oz (75.2 kg)  04/30/17 181 lb 6.4 oz (82.3 kg)    Patient Active Problem List   Diagnosis Date Noted  . Asthma 09/26/2016  . RHINITIS 10/27/2007  . COPD GOLD II with reversibility  10/27/2007    Past Medical History:  Diagnosis Date  . COPD (chronic obstructive pulmonary disease) (HCC)   . Rhinitis     No past surgical history on file.  Social History  Substance Use Topics  . Smoking status: Former Smoker    Packs/day: 2.00    Years: 20.00    Types: Cigarettes    Quit date: 11/08/2002  . Smokeless tobacco: Never Used  . Alcohol use No    Family History  Problem Relation Age of Onset  . Emphysema Mother        smoked  .  Lung cancer Mother        smoked    Allergies  Allergen Reactions  . Aspirin   . Bupropion Hcl   . Penicillins Hives    Medication list has been reviewed and updated.  Current Outpatient Prescriptions on File Prior to Visit  Medication Sig Dispense Refill  . BREO ELLIPTA 100-25 MCG/INH AEPB INHALE 1 PUFF INTO THE LUNGS EVERY MORNING 60 each 11  . albuterol (PROAIR HFA) 108 (90 Base) MCG/ACT inhaler inhale 2 puffs by mouth INTO THE LUNGS every 4 hours if needed for wheezing (Patient not taking: Reported on 10/06/2017) 8 g 6   No current facility-administered medications on file prior to visit.     Review of Systems:  As per HPI- otherwise negative. No fever or chills No CP or SOB No cough, ST, nausea, vomiting or diarrhea    Physical Examination: Vitals:   10/06/17 1402  BP: 130/82  Pulse: 97  Temp: 98.7 F (37.1 C)   Vitals:   10/06/17 1402  Weight: 165 lb (74.8 kg)  Height: 5\' 8"  (1.727 m)   Body mass index is  25.09 kg/m. Ideal Body Weight: Weight in (lb) to have BMI = 25: 164.1  GEN: WDWN, NAD, Non-toxic, A & O x 3, looks well, normal weight HEENT: Atraumatic, Normocephalic. Neck supple. No masses, No LAD.  Bilateral TM wnl, oropharynx normal.  PEERL,EOMI.   Ears and Nose: No external deformity. CV: RRR, No M/G/R. No JVD. No thrill. No extra heart sounds. PULM: CTA B, no wheezes, crackles, rhonchi. No retractions. No resp. distress. No accessory muscle use. ABD: S, NT, ND, +BS. No rebound. No HSM. EXTR: No c/c/e NEURO Normal gait.  PSYCH: Normally interactive. Conversant. Not depressed or anxious appearing.  Calm demeanor.  Gu: at 11:00 on the very base of the penis is a tiny, smaller than a BB nodule. It seems freely mobile.  No associated masses or lesions, no warts  Assessment and Plan: Screening for deficiency anemia - Plan: CBC  Screening for hyperlipidemia - Plan: Lipid panel  Screening for prostate cancer - Plan: PSA  Screening for diabetes  mellitus - Plan: Comprehensive metabolic panel, Hemoglobin A1c  Encounter for hepatitis C screening test for low risk patient - Plan: Hepatitis C antibody  Immunization due - Plan: Tdap vaccine greater than or equal to 7yo IM  Screening for colon cancer here today to establish care Ordered labs as above tdap given Offered urology referral vs monitoring of his penile nodule- suspect it is quite benign. For the time being he prefers to monitor which I think is a fine plan.  They will let me know if any changes in this regard  Ordered cologuard Labs pending    Signed Abbe AmsterdamJessica Baird Polinski, MD  Results for orders placed or performed in visit on 10/06/17  CBC  Result Value Ref Range   WBC 5.3 4.0 - 10.5 K/uL   RBC 4.09 (L) 4.22 - 5.81 Mil/uL   Platelets 278.0 150.0 - 400.0 K/uL   Hemoglobin 13.9 13.0 - 17.0 g/dL   HCT 16.141.3 09.639.0 - 04.552.0 %   MCV 100.9 (H) 78.0 - 100.0 fl   MCHC 33.6 30.0 - 36.0 g/dL   RDW 40.913.9 81.111.5 - 91.415.5 %  Comprehensive metabolic panel  Result Value Ref Range   Sodium 138 135 - 145 mEq/L   Potassium 4.2 3.5 - 5.1 mEq/L   Chloride 97 96 - 112 mEq/L   CO2 31 19 - 32 mEq/L   Glucose, Bld 104 (H) 70 - 99 mg/dL   BUN 19 6 - 23 mg/dL   Creatinine, Ser 7.820.96 0.40 - 1.50 mg/dL   Total Bilirubin 0.5 0.2 - 1.2 mg/dL   Alkaline Phosphatase 53 39 - 117 U/L   AST 20 0 - 37 U/L   ALT 20 0 - 53 U/L   Total Protein 7.2 6.0 - 8.3 g/dL   Albumin 4.3 3.5 - 5.2 g/dL   Calcium 9.6 8.4 - 95.610.5 mg/dL   GFR 21.3085.24 >86.57>60.00 mL/min  Hemoglobin A1c  Result Value Ref Range   Hgb A1c MFr Bld 5.5 4.6 - 6.5 %  Lipid panel  Result Value Ref Range   Cholesterol 234 (H) 0 - 200 mg/dL   Triglycerides 846.9223.0 (H) 0.0 - 149.0 mg/dL   HDL 62.9555.10 >28.41>39.00 mg/dL   VLDL 32.444.6 (H) 0.0 - 40.140.0 mg/dL   Total CHOL/HDL Ratio 4    NonHDL 179.25   PSA  Result Value Ref Range   PSA 0.29 0.10 - 4.00 ng/mL  LDL cholesterol, direct  Result Value Ref Range   Direct LDL 151.0 mg/dL  Message to pt

## 2017-10-06 ENCOUNTER — Encounter: Payer: Self-pay | Admitting: Family Medicine

## 2017-10-06 ENCOUNTER — Ambulatory Visit (INDEPENDENT_AMBULATORY_CARE_PROVIDER_SITE_OTHER): Payer: Managed Care, Other (non HMO) | Admitting: Family Medicine

## 2017-10-06 VITALS — BP 130/82 | HR 97 | Temp 98.7°F | Ht 68.0 in | Wt 165.0 lb

## 2017-10-06 DIAGNOSIS — Z125 Encounter for screening for malignant neoplasm of prostate: Secondary | ICD-10-CM

## 2017-10-06 DIAGNOSIS — Z130 Encounter for screening for diseases of the blood and blood-forming organs and certain disorders involving the immune mechanism: Secondary | ICD-10-CM | POA: Diagnosis not present

## 2017-10-06 DIAGNOSIS — Z13220 Encounter for screening for lipoid disorders: Secondary | ICD-10-CM

## 2017-10-06 DIAGNOSIS — Z131 Encounter for screening for diabetes mellitus: Secondary | ICD-10-CM | POA: Diagnosis not present

## 2017-10-06 DIAGNOSIS — Z23 Encounter for immunization: Secondary | ICD-10-CM | POA: Diagnosis not present

## 2017-10-06 DIAGNOSIS — Z1211 Encounter for screening for malignant neoplasm of colon: Secondary | ICD-10-CM

## 2017-10-06 DIAGNOSIS — Z1159 Encounter for screening for other viral diseases: Secondary | ICD-10-CM

## 2017-10-06 LAB — COMPREHENSIVE METABOLIC PANEL
ALBUMIN: 4.3 g/dL (ref 3.5–5.2)
ALT: 20 U/L (ref 0–53)
AST: 20 U/L (ref 0–37)
Alkaline Phosphatase: 53 U/L (ref 39–117)
BUN: 19 mg/dL (ref 6–23)
CALCIUM: 9.6 mg/dL (ref 8.4–10.5)
CO2: 31 mEq/L (ref 19–32)
CREATININE: 0.96 mg/dL (ref 0.40–1.50)
Chloride: 97 mEq/L (ref 96–112)
GFR: 85.24 mL/min (ref >60.00)
Glucose, Bld: 104 mg/dL — ABNORMAL HIGH (ref 70–99)
Potassium: 4.2 mEq/L (ref 3.5–5.1)
Sodium: 138 mEq/L (ref 135–145)
Total Bilirubin: 0.5 mg/dL (ref 0.2–1.2)
Total Protein: 7.2 g/dL (ref 6.0–8.3)

## 2017-10-06 LAB — LIPID PANEL
CHOL/HDL RATIO: 4
CHOLESTEROL: 234 mg/dL — AB (ref 0–200)
HDL: 55.1 mg/dL (ref >39.00)
NonHDL: 179.25
Triglycerides: 223 mg/dL — ABNORMAL HIGH (ref 0.0–149.0)
VLDL: 44.6 mg/dL — ABNORMAL HIGH (ref 0.0–40.0)

## 2017-10-06 LAB — CBC
HEMATOCRIT: 41.3 % (ref 39.0–52.0)
Hemoglobin: 13.9 g/dL (ref 13.0–17.0)
MCHC: 33.6 g/dL (ref 30.0–36.0)
MCV: 100.9 fl — AB (ref 78.0–100.0)
PLATELETS: 278 10*3/uL (ref 150.0–400.0)
RBC: 4.09 Mil/uL — AB (ref 4.22–5.81)
RDW: 13.9 % (ref 11.5–15.5)
WBC: 5.3 10*3/uL (ref 4.0–10.5)

## 2017-10-06 LAB — LDL CHOLESTEROL, DIRECT: Direct LDL: 151 mg/dL

## 2017-10-06 LAB — HEMOGLOBIN A1C: HEMOGLOBIN A1C: 5.5 % (ref 4.6–6.5)

## 2017-10-06 LAB — PSA: PSA: 0.29 ng/mL (ref 0.10–4.00)

## 2017-10-06 NOTE — Patient Instructions (Signed)
It was nice to see you again today!  Take care and I will be in touch with your labs asap Great job with weight loss and smoking cessation! You got your "tdap" tetanus booster/whooping cough booster today  We will order Cologuard testing for you- simply compete the test at home and send it in  Please continue to monitor the cyst we looked at today- if any changes please alert me

## 2017-10-07 LAB — HEPATITIS C ANTIBODY
HEP C AB: NONREACTIVE
SIGNAL TO CUT-OFF: 0.01 (ref <1.00)

## 2017-10-20 ENCOUNTER — Encounter: Payer: Self-pay | Admitting: Family Medicine

## 2017-10-20 ENCOUNTER — Ambulatory Visit: Payer: Managed Care, Other (non HMO) | Admitting: Family Medicine

## 2017-10-20 VITALS — BP 102/64 | HR 95 | Temp 98.9°F | Ht 68.0 in | Wt 163.4 lb

## 2017-10-20 DIAGNOSIS — J029 Acute pharyngitis, unspecified: Secondary | ICD-10-CM

## 2017-10-20 LAB — CBC
HEMATOCRIT: 39.5 % (ref 39.0–52.0)
Hemoglobin: 13.1 g/dL (ref 13.0–17.0)
MCHC: 33.2 g/dL (ref 30.0–36.0)
MCV: 101.7 fl — AB (ref 78.0–100.0)
Platelets: 227 10*3/uL (ref 150.0–400.0)
RBC: 3.88 Mil/uL — ABNORMAL LOW (ref 4.22–5.81)
RDW: 14.2 % (ref 11.5–15.5)
WBC: 4.3 10*3/uL (ref 4.0–10.5)

## 2017-10-20 LAB — POCT RAPID STREP A (OFFICE): Rapid Strep A Screen: NEGATIVE

## 2017-10-20 NOTE — Progress Notes (Addendum)
[facility] [address] [place], [place] [address] [phone] [phone]  Date:  10/20/2017   Name:  Jared [name]   DOB:  08/27/1956   MRN:  [id number]  PCP:  Pearline Cablesopland, Shamari Trostel C, MD    Chief Complaint: Ear Pain (c/o left ear, jaw and throat pain x 1 week. Pt states that sx's have worsened. )   History of Present Illness:  Jared [name] is a 61 y.o. very pleasant male patient who presents with the following:  Seen just recently for a routine visit, now here with illness  Here today with concern of a sore throat for about one week It does not seem to be getting better advil helps but he can tell when it wears off He has noted some likely referred pain to his left ear He has a sore node in his neck His teeth also hurt at bit  No fever or chills No CP or SOB He worsening of his usual cough No GI symptoms No recnet dental x-rays He did have a gum graft not too long ago   Patient Active Problem List   Diagnosis Date Noted  . Asthma 09/26/2016  . RHINITIS 10/27/2007  . COPD GOLD II with reversibility  10/27/2007    Past Medical History:  Diagnosis Date  . COPD (chronic obstructive pulmonary disease) (HCC)   . Rhinitis     No past surgical history on file.  Social History   Tobacco Use  . Smoking status: Former Smoker    Packs/day: 2.00    Years: 20.00    Pack years: 40.00    Types: Cigarettes    Last attempt to quit: 11/08/2002    Years since quitting: 14.9  . Smokeless tobacco: Never Used  Substance Use Topics  . Alcohol use: No  . Drug use: No    Family History  Problem Relation Age of Onset  . Emphysema Mother        smoked  . Lung cancer Mother        smoked    Allergies  Allergen Reactions  . Aspirin   . Bupropion Hcl   . Penicillins Hives    Medication list has been reviewed and updated.  Current Outpatient Medications on File Prior to Visit  Medication Sig Dispense Refill  .  albuterol (PROAIR HFA) 108 (90 Base) MCG/ACT inhaler inhale 2 puffs by mouth INTO THE LUNGS every 4 hours if needed for wheezing 8 g 6  . BREO ELLIPTA 100-25 MCG/INH AEPB INHALE 1 PUFF INTO THE LUNGS EVERY MORNING 60 each 11   No current facility-administered medications on file prior to visit.     Review of Systems:  As per HPI- otherwise negative. No rash He does not notice any particular, consistently painful tooth   Physical Examination: There were no vitals filed for this visit. Vitals:   10/20/17 1314  Height: 5\' 8"  (1.727 m)   Body mass index is 25.09 kg/m. Ideal Body Weight: Weight in (lb) to have BMI = 25: 164.1  GEN: WDWN, NAD, Non-toxic, A & O x 3, normal weight, looks well HEENT: Atraumatic, Normocephalic. Neck supple. No masses, No LAD.  Bilateral TM wnl, oropharynx normal.  PEERL,EOMI.   Ears and Nose: No external deformity. CV: RRR, No M/G/R. No JVD. No thrill. No extra heart sounds. PULM: CTA B, no wheezes, crackles, rhonchi. No retractions. No resp. distress. No accessory muscle use. EXTR: No c/c/e NEURO Normal  gait.  PSYCH: Normally interactive. Conversant. Not depressed or anxious appearing.  Calm demeanor.   Results for orders placed or performed in visit on 10/20/17  POCT rapid strep A  Result Value Ref Range   Rapid Strep A Screen Negative Negative    Assessment and Plan: Pharyngitis, unspecified etiology - Plan: POCT rapid strep A, CBC  Here today with a ST for about one week Rapid strep is negative Gave a sample of xyzal for him to try for possible allergies  Check CBC today He will let me know if not better or if getting worse   Signed Abbe AmsterdamJessica Carlina Derks, MD  Received CBC  Results for orders placed or performed in visit on 10/20/17  CBC  Result Value Ref Range   WBC 4.3 4.0 - 10.5 K/uL   RBC 3.88 (L) 4.22 - 5.81 Mil/uL   Platelets 227.0 150.0 - 400.0 K/uL   Hemoglobin 13.1 13.0 - 17.0 g/dL   HCT 40.939.5 81.139.0 - 91.452.0 %   MCV 101.7 (H) 78.0  - 100.0 fl   MCHC 33.2 30.0 - 36.0 g/dL   RDW 78.214.2 95.611.5 - 21.315.5 %  POCT rapid strep A  Result Value Ref Range   Rapid Strep A Screen Negative Negative

## 2017-10-20 NOTE — Patient Instructions (Signed)
It was good to see you today- take care and please keep me posted about your symptoms  Try the Xyzal for a few days and I will be in touch with your blood count.

## 2017-11-06 ENCOUNTER — Telehealth: Payer: Self-pay | Admitting: *Deleted

## 2017-11-06 NOTE — Telephone Encounter (Signed)
Received Application and Renewal of Disability Parking Placard form [place] [employer]; forward to provider/SLS 11/29

## 2017-12-11 ENCOUNTER — Telehealth: Payer: Self-pay | Admitting: *Deleted

## 2017-12-11 NOTE — Telephone Encounter (Signed)
Received Cologuard Order Cancellation: [id number]; order has been changed to Suspended for Inactivity, the order will be reactivated if patient returns their sample w/i 365 days of the Initial order. [employer] has made several attempts to reach patient by phone and letter unsuccessfully; forwarded to provider/SLS 01/03

## 2018-07-26 ENCOUNTER — Other Ambulatory Visit: Payer: Self-pay | Admitting: Internal Medicine

## 2018-08-26 ENCOUNTER — Other Ambulatory Visit: Payer: Self-pay | Admitting: Internal Medicine

## 2018-09-01 ENCOUNTER — Encounter: Payer: Self-pay | Admitting: Internal Medicine

## 2018-09-01 ENCOUNTER — Ambulatory Visit: Payer: Managed Care, Other (non HMO) | Admitting: Internal Medicine

## 2018-09-01 VITALS — BP 126/78 | HR 100 | Ht 67.0 in | Wt 158.2 lb

## 2018-09-01 DIAGNOSIS — J449 Chronic obstructive pulmonary disease, unspecified: Secondary | ICD-10-CM

## 2018-09-01 MED ORDER — FLUTICASONE-UMECLIDIN-VILANT 100-62.5-25 MCG/INH IN AEPB: 1.0000 | INHALATION_SPRAY | Freq: Every day | RESPIRATORY_TRACT | 11 refills | Status: DC

## 2018-09-01 MED ORDER — FLUTICASONE-UMECLIDIN-VILANT 100-62.5-25 MCG/INH IN AEPB: 1.0000 | INHALATION_SPRAY | Freq: Every day | RESPIRATORY_TRACT | 0 refills | Status: DC

## 2018-09-01 NOTE — Patient Instructions (Signed)
Try trelegy one click each am and fill the prescription if you feel it helps your breathing / wheezing    Please schedule a follow up visit in 12  months but call sooner if needed

## 2018-09-01 NOTE — Assessment & Plan Note (Signed)
-   alpha One AT level 137  01/25/03> genotype   03/23/13 =  MM - PFT's 03/2007 FEV 67% with ratio of 53 and DLC0 98%  - PFT's 03/23/2013  FEV 1.25 (38%) ratio of 40 and 34% p B2 to FEV1 of 51%  and DLCO 97% - PFTs 03/03/2014   FEV 1.29(36%) ratio 48 with 32% better p B2 to FEV1 48% and DLCO 103%  - HFA 100% 12/31/2011  - Singulair started 03/23/13 > > off as of 09/21/13  > worse 11/05/2013 so restarted  - trial of Breo 03/03/2014 > improved 07/14/2014 s cramping > f/u yearly  - 08/22/2015 trial off spiriva > no worse  - PFT's  08/27/2016  FEV1 1.32  (38 % ) ratio 50  p 4 % improvement from saba p Breo 100 prior to study with DLCO  96/101 % corrects to 121 % for alv volume   -trial of trelegy 09/01/2018    Still actively wheezing on Breo 100 so will try step up to trelgy one click each am on trial basis   - The proper method of use, as well as anticipated side effects, of an elipta  inhaler are discussed and demonstrated to the patient.    I had an extended discussion with the patient reviewing all relevant studies completed to date and  lasting 15 to 20 minutes of a 25 minute visit    See device teaching which extended face to face time for this visit.  Each maintenance medication was reviewed in detail including emphasizing most importantly the difference between maintenance and prns and under what circumstances the prns are to be triggered using an action plan format that is not reflected in the computer generated alphabetically organized AVS which I have not found useful in most complex patients, especially with respiratory illnesses  Please see AVS for specific instructions unique to this visit that I personally wrote and verbalized to the the pt in detail and then reviewed with pt  by my nurse highlighting any  changes in therapy recommended at today's visit to their plan of care.
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
## 2019-02-14 ENCOUNTER — Other Ambulatory Visit: Payer: Self-pay | Admitting: Internal Medicine

## 2019-03-10 ENCOUNTER — Other Ambulatory Visit: Payer: Self-pay | Admitting: Internal Medicine

## 2019-03-10 MED ORDER — ALBUTEROL SULFATE HFA 108 (90 BASE) MCG/ACT IN AERS: INHALATION_SPRAY | RESPIRATORY_TRACT | 5 refills | Status: DC

## 2019-03-10 MED ORDER — ALBUTEROL SULFATE HFA 108 (90 BASE) MCG/ACT IN AERS: 2.0000 | INHALATION_SPRAY | RESPIRATORY_TRACT | 5 refills | Status: DC | PRN

## 2019-08-18 MED ORDER — FLUCONAZOLE 100 MG PO TABS: 100.0000 mg | ORAL_TABLET | Freq: Every day | ORAL | 0 refills | Status: DC

## 2019-08-18 MED ORDER — NYSTATIN 100000 UNIT/GM EX POWD: Freq: Two times a day (BID) | CUTANEOUS | 0 refills | Status: DC

## 2019-08-18 NOTE — Telephone Encounter (Signed)
Dr. Melvyn Novas this patient has developed 'sudden' rash under arms and in pelvic area that is itchy and spreading.  He is questioning if it could be the trelegy.  Long term use of trelegy.  Please advise.  Thank you  LOV 09/01/18 COPD with Wert - alpha One AT level 137  01/25/03> genotype   03/23/13 =  MM - PFT's 03/2007 FEV 67% with ratio of 53 and DLC0 98%  - PFT's 03/23/2013  FEV 1.25 (38%) ratio of 40 and 34% p B2 to FEV1 of 51%  and DLCO 97% - PFTs 03/03/2014   FEV 1.29(36%) ratio 48 with 32% better p B2 to FEV1 48% and DLCO 103%  - HFA 100% 12/31/2011  - Singulair started 03/23/13 > > off as of 09/21/13  > worse 11/05/2013 so restarted  - trial of Breo 03/03/2014 > improved 07/14/2014 s cramping > f/u yearly  - 08/22/2015 trial off spiriva > no worse  - PFT's  08/27/2016  FEV1 1.32  (38 % ) ratio 50  p 4 % improvement from saba p Breo 100 prior to study with DLCO  96/101 % corrects to 121 % for alv volume   -trial of trelegy 09/01/2018   Still actively wheezing on Breo 100 so will try step up to trelgy one click each am on trial basis   Patient has an appt scheduled 09/03/19 with Dr.Wert for 1 year follow up.   Routed to Dr. Melvyn Novas for review/recommendations.

## 2019-08-18 NOTE — Telephone Encounter (Signed)
Very unlikely due to trelegy - more likely a condition called intertrigo typically seen with candida infections so rec diflucan 100 mg daily x 3 days and nystatin powder bid and benadryl otc prn itching and f/u with PCP or derm if not improving otherwise I'll check it at ov planned

## 2019-09-03 ENCOUNTER — Encounter: Payer: Self-pay | Admitting: Internal Medicine

## 2019-09-03 ENCOUNTER — Ambulatory Visit: Payer: Managed Care, Other (non HMO) | Admitting: Internal Medicine

## 2019-09-03 ENCOUNTER — Other Ambulatory Visit: Payer: Self-pay

## 2019-09-03 ENCOUNTER — Ambulatory Visit (INDEPENDENT_AMBULATORY_CARE_PROVIDER_SITE_OTHER): Payer: Managed Care, Other (non HMO) | Admitting: Internal Medicine

## 2019-09-03 DIAGNOSIS — J449 Chronic obstructive pulmonary disease, unspecified: Secondary | ICD-10-CM | POA: Diagnosis not present

## 2019-09-03 DIAGNOSIS — B372 Candidiasis of skin and nail: Secondary | ICD-10-CM | POA: Diagnosis not present

## 2019-09-03 MED ORDER — TRELEGY ELLIPTA 100-62.5-25 MCG/INH IN AEPB: 1.0000 | INHALATION_SPRAY | Freq: Every day | RESPIRATORY_TRACT | 3 refills | Status: DC

## 2019-09-03 NOTE — Assessment & Plan Note (Signed)
Quit smoking 2003  - alpha One AT level 137  01/25/03> genotype   03/23/13 =  MM - PFT's 03/2007 FEV 67% with ratio of 53 and DLC0 98%  - PFT's 03/23/2013  FEV 1.25 (38%) ratio of 40 and 34% p B2 to FEV1 of 51%  and DLCO 97% - PFTs 03/03/2014   FEV 1.29(36%) ratio 48 with 32% better p B2 to FEV1 48% and DLCO 103%  - HFA 100% 12/31/2011  - Singulair started 03/23/13 > > off as of 09/21/13  > worse 11/05/2013 so restarted  - trial of Breo 03/03/2014 > improved 07/14/2014 s cramping > f/u yearly  - 08/22/2015 trial off spiriva > no worse  - PFT's  08/27/2016  FEV1 1.32  (38 % ) ratio 50  p 4 % improvement from saba p Breo 100 prior to study with DLCO  96/101 % corrects to 121 % for alv volume   -trial of trelegy 09/01/2018 >>> improved as of televisit 09/03/2019 > refill x one year.   Group D in terms of symptom/risk and laba/lama/ICS  therefore appropriate rx at this point >>>  trelegy appropriate   Adequate control on present rx, reviewed in detail with pt > no change in rx needed    F/u yearly

## 2019-09-03 NOTE — Assessment & Plan Note (Signed)
rx diflucan 100 mg daily x 3 days 08/18/19 > 100% resolved   rec check for dm per PCP, keep skin folds cool and dry

## 2019-09-03 NOTE — Patient Instructions (Signed)
Pt has Mychart  No change in medications but I recommend a physical checking to be sure you are not developing diabetes based on your problem with candidal intertrigo.  Please schedule a follow up visit in 12 months but call sooner if needed
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
## 2020-08-27 ENCOUNTER — Other Ambulatory Visit: Payer: Self-pay | Admitting: Internal Medicine

## 2020-09-06 ENCOUNTER — Encounter: Payer: Self-pay | Admitting: Family Medicine

## 2020-09-11 ENCOUNTER — Telehealth: Payer: Self-pay | Admitting: Internal Medicine

## 2020-09-11 DIAGNOSIS — J449 Chronic obstructive pulmonary disease, unspecified: Secondary | ICD-10-CM

## 2020-09-11 NOTE — Telephone Encounter (Signed)
Tried calling the pt and there was no answer had to [facility]

## 2020-09-12 NOTE — Telephone Encounter (Signed)
Pt returning call.  [phone].

## 2020-09-12 NOTE — Telephone Encounter (Signed)
Called and spoke with pt. Pt is requesting for Dr. Sherene Sires to write a letter for exemption from him receiving the covid vaccine due to medical conditions. Pt said if he does not get a letter of exemption, he will lose his job. If he loses his job, he will then need to switch from Trelegy to [place] at next OV once he runs out of the current supply that he has.  Dr. Sherene Sires, please advise on this.

## 2020-09-12 NOTE — Telephone Encounter (Signed)
Reviewed with pt: Pt informed of the seriousness of COVID 19 infection as a direct risk to lung health  and safey and to close contacts and should continue to wear a facemask in public and minimize exposure to public locations but especially avoid any area or activity where non-close contacts are not observing distancing or wearing an appropriate face mask.  I strongly recommended she take either of the vaccines available through local drugstores based on updated information on millions of Americans treated with the Moderna and [employer] products  which have proven both safe and  effective even against the new delta variant.    Thinks he may already be immune from exp to sick wife in Jan 2021 (which likely would not have been the delta) but for the purpose of keeping his job he may be able to get an exemption to keep from getting fired if his Antibodies are positive so I rec  Check IgG before lunch and I will call the results but advised there is no medical exemption per se even if the Ab is positive, just mitigation of argument that he should be fired.

## 2020-09-14 ENCOUNTER — Other Ambulatory Visit: Payer: Managed Care, Other (non HMO)

## 2020-09-14 DIAGNOSIS — J449 Chronic obstructive pulmonary disease, unspecified: Secondary | ICD-10-CM

## 2020-09-14 LAB — SARS-COV-2 IGG: SARS-COV-2 IgG: 0.06

## 2020-09-26 NOTE — Patient Instructions (Signed)
It was good to see you again today, I will be in touch your labs soon as possible For gout, you can use colchicine as needed for attack.  We will check your uric acid level today and if elevated we can consider a preventative medication if you like  We will send you a cologuard kit to screen for colon cancer at home BP goal- 120- 140/70- 85.  If you are persistently higher than these numbers at home let me know and we can try a touch of medication Try some voltaren/ diclofenac gel on your tender foot joint- this is likely caused by arthritis.    I do encourage you to be vaccinated against covid 19, flu, and shingles!    Please see me in about 6 months and take care.  Great job with exercise    Health Maintenance, Male Adopting a healthy lifestyle and getting preventive care are important in promoting health and wellness. Ask your health care provider about:  The right schedule for you to have regular tests and exams.  Things you can do on your own to prevent diseases and keep yourself healthy. What should I know about diet, weight, and exercise? Eat a healthy diet   Eat a diet that includes plenty of vegetables, fruits, low-fat dairy products, and lean protein.  Do not eat a lot of foods that are high in solid fats, added sugars, or sodium. Maintain a healthy weight Body mass index (BMI) is a measurement that can be used to identify possible weight problems. It estimates body fat based on height and weight. Your health care provider can help determine your BMI and help you achieve or maintain a healthy weight. Get regular exercise Get regular exercise. This is one of the most important things you can do for your health. Most adults should:  Exercise for at least 150 minutes each week. The exercise should increase your heart rate and make you sweat (moderate-intensity exercise).  Do strengthening exercises at least twice a week. This is in addition to the moderate-intensity  exercise.  Spend less time sitting. Even light physical activity can be beneficial. Watch cholesterol and blood lipids Have your blood tested for lipids and cholesterol at 62 years of age, then have this test every 5 years. You may need to have your cholesterol levels checked more often if:  Your lipid or cholesterol levels are high.  You are older than 62 years of age.  You are at high risk for heart disease. What should I know about cancer screening? Many types of cancers can be detected early and may often be prevented. Depending on your health history and family history, you may need to have cancer screening at various ages. This may include screening for:  Colorectal cancer.  Prostate cancer.  Skin cancer.  Lung cancer. What should I know about heart disease, diabetes, and high blood pressure? Blood pressure and heart disease  High blood pressure causes heart disease and increases the risk of stroke. This is more likely to develop in people who have high blood pressure readings, are of African descent, or are overweight.  Talk with your health care provider about your target blood pressure readings.  Have your blood pressure checked: ? Every 3-5 years if you are 11-62 years of age. ? Every year if you are 62 years old or older.  If you are between the ages of 56 and 66 and are a current or former smoker, ask your health care provider if you  should have a one-time screening for abdominal aortic aneurysm (AAA). Diabetes Have regular diabetes screenings. This checks your fasting blood sugar level. Have the screening done:  Once every three years after age 62 if you are at a normal weight and have a low risk for diabetes.  More often and at a younger age if you are overweight or have a high risk for diabetes. What should I know about preventing infection? Hepatitis B If you have a higher risk for hepatitis B, you should be screened for this virus. Talk with your health care  provider to find out if you are at risk for hepatitis B infection. Hepatitis C Blood testing is recommended for:  Everyone born from [birth year] through 1965.  Anyone with known risk factors for hepatitis C. Sexually transmitted infections (STIs)  You should be screened each year for STIs, including gonorrhea and chlamydia, if: ? You are sexually active and are younger than 62 years of age. ? You are older than 62 years of age and your health care provider tells you that you are at risk for this type of infection. ? Your sexual activity has changed since you were last screened, and you are at increased risk for chlamydia or gonorrhea. Ask your health care provider if you are at risk.  Ask your health care provider about whether you are at high risk for HIV. Your health care provider may recommend a prescription medicine to help prevent HIV infection. If you choose to take medicine to prevent HIV, you should first get tested for HIV. You should then be tested every 3 months for as long as you are taking the medicine. Follow these instructions at home: Lifestyle  Do not use any products that contain nicotine or tobacco, such as cigarettes, e-cigarettes, and chewing tobacco. If you need help quitting, ask your health care provider.  Do not use street drugs.  Do not share needles.  Ask your health care provider for help if you need support or information about quitting drugs. Alcohol use  Do not drink alcohol if your health care provider tells you not to drink.  If you drink alcohol: ? Limit how much you have to 0-2 drinks a day. ? Be aware of how much alcohol is in your drink. In the [place] one drink equals one 12 oz bottle of beer (355 mL), one 5 oz glass of wine (148 mL), or one 1 oz glass of hard liquor (44 mL). General instructions  Schedule regular health, dental, and eye exams.  Stay current with your vaccines.  Tell your health care provider if: ? You often feel depressed. ? You  have ever been abused or do not feel safe at home. Summary  Adopting a healthy lifestyle and getting preventive care are important in promoting health and wellness.  Follow your health care provider's instructions about healthy diet, exercising, and getting tested or screened for diseases.  Follow your health care provider's instructions on monitoring your cholesterol and blood pressure. This information is not intended to replace advice given to you by your health care provider. Make sure you discuss any questions you have with your health care provider. Document Revised: 11/18/2018 Document Reviewed: 11/18/2018 Elsevier Patient Education  2020 [employer].

## 2020-09-26 NOTE — Progress Notes (Addendum)
[facility] [address], [address] [place], [place] [address] [phone]  Date:  09/28/2020   Name:  Jared [name]   DOB:  [birth year]   MRN:  [id number]  PCP:  Pearline Cables, MD    Chief Complaint: Annual Exam and Foot Pain (protudance left lateral foot, left foot)   History of Present Illness:  Jared [name] is a 64 y.o. very pleasant male patient who presents with the following:  Patient today for physical.  History of COPD, rhinitis Last seen by myself in 2018 He had recently contacted me about getting a possible exemption from COVID-19 vaccine due to COPD and asthma-I advised him that this actually was a reason to get the vaccine as opposed to a reason to avoid it His pulmonologist is Dr Sherene Sires- he is using trelligy and rarely needs albuterol  His family is doing well- married to my patient [name]   Colon cancer screening- he is overdue, will re-order a cologuard kit for him Flu vaccine pt declines today  Most recent labs 2018; he is fasting this am  He is also requesting COVID-19 antibody test as well as T-cell immunity screening  BP Readings from Last 3 Encounters:  09/28/20 (!) 142/88  09/01/18 126/78  10/20/17 102/64   He does check his BP at home on occasion-  He stopped nicotine gum, and has picked up exercise by walking 30 minutes a day most days. He is doing this about 6 months now, he is improving his fitness   Former smoker- quit in 2003  He has had a couple of gout attacks in the left foot over the last year- he is not sure of trigger foods.  He was seen at [facility] and given some colchicine which did help  He would like to have some on hand in case of attack Patient Active Problem List   Diagnosis Date Noted  . Candidiasis, intertrigo 09/03/2019  . Asthma 09/26/2016  . RHINITIS 10/27/2007  . COPD GOLD III with reversibility  10/27/2007    Past Medical History:  Diagnosis Date  . COPD (chronic  obstructive pulmonary disease) (HCC)   . Rhinitis     History reviewed. No pertinent surgical history.  Social History   Tobacco Use  . Smoking status: Former Smoker    Packs/day: 2.00    Years: 20.00    Pack years: 40.00    Types: Cigarettes    Quit date: 11/08/2002    Years since quitting: 17.9  . Smokeless tobacco: Never Used  Substance Use Topics  . Alcohol use: No  . Drug use: No    Family History  Problem Relation Age of Onset  . Emphysema Mother        smoked  . Lung cancer Mother        smoked    Allergies  Allergen Reactions  . Aspirin   . Bupropion Hcl   . Penicillins Hives    Medication list has been reviewed and updated.  Current Outpatient Medications on File Prior to Visit  Medication Sig Dispense Refill  . albuterol (PROVENTIL HFA) 108 (90 Base) MCG/ACT inhaler Inhale 2 puffs into the lungs every 4 (four) hours as needed for wheezing or shortness of breath. Please specify directions, refills and quantity 1 Inhaler 5  . TRELEGY ELLIPTA 100-62.5-25 MCG/INH AEPB TAKE 1 PUFF BY MOUTH EVERY DAY 180 each 0   No current facility-administered medications on file prior to visit.  Review of Systems:  As per HPI- otherwise negative.   Physical Examination: Vitals:   09/28/20 0822  BP: (!) 142/88  Pulse: 100  Resp: 16  SpO2: 95%   Vitals:   09/28/20 0822  Weight: 169 lb (76.7 kg)  Height: _0  (1.702 m)   Body mass index is 26.47 kg/m. Ideal Body Weight: Weight in (lb) to have BMI = 25: 159.3  GEN: no acute distress.  Mild overweight, looks well  HEENT: Atraumatic, Normocephalic.  Ears and Nose: No external deformity. CV: RRR, No M/G/R. No JVD. No thrill. No extra heart sounds. PULM: CTA B, no wheezes, crackles, rhonchi. No retractions. No resp. distress. No accessory muscle use. ABD: S, NT, ND, +BS. No rebound. No HSM. EXTR: No c/c/e PSYCH: Normally interactive. Conversant.  Tenderness over 5th MT/ carpal joint left  foot   Assessment and Plan: Physical exam  Screening for prostate cancer - Plan: PSA  Screening for deficiency anemia - Plan: CBC  Screening for diabetes mellitus - Plan: Comprehensive metabolic panel, Hemoglobin A1c  Screening for hyperlipidemia - Plan: Lipid panel  Acute idiopathic gout of left foot - Plan: Uric acid, colchicine 0.6 MG tablet   Patient today for physical exam.  Encouraged healthy diet and exercise routine Will plan further follow- up pending labs. He is not interested in covid or flu vaccine right now- encouraged these cologuard ordered Discussed BP parameters, voltaren gel for foot OA.  He declines x-ray right now  See patient instructions for more details.     This visit occurred during the SARS-CoV-2 public health emergency.  Safety protocols were in place, including screening questions prior to the visit, additional usage of staff PPE, and extensive cleaning of exam room while observing appropriate contact time as indicated for disinfecting solutions.    Signed Lamar Blinks, MD  Addendum 10/22, received labs as below.  Message to patient  Results for orders placed or performed in visit on 09/28/20  CBC  Result Value Ref Range   WBC 5.8 3.8 - 10.8 Thousand/uL   RBC 4.33 4.20 - 5.80 Million/uL   Hemoglobin 15.9 13.2 - 17.1 g/dL   HCT 44.9 38 - 50 %   MCV 103.7 (H) 80.0 - 100.0 fL   MCH 36.7 (H) 27.0 - 33.0 pg   MCHC 35.4 32.0 - 36.0 g/dL   RDW 13.5 11.0 - 15.0 %   Platelets 224 140 - 400 Thousand/uL   MPV 9.9 7.5 - 12.5 fL  Comprehensive metabolic panel  Result Value Ref Range   Glucose, Bld 84 65 - 99 mg/dL   BUN 19 7 - 25 mg/dL   Creat 0.84 0.70 - 1.25 mg/dL   BUN/Creatinine Ratio NOT APPLICABLE 6 - 22 (calc)   Sodium 138 135 - 146 mmol/L   Potassium 4.4 3.5 - 5.3 mmol/L   Chloride 97 (L) 98 - 110 mmol/L   CO2 28 20 - 32 mmol/L   Calcium 9.9 8.6 - 10.3 mg/dL   Total Protein 7.8 6.1 - 8.1 g/dL   Albumin 4.9 3.6 - 5.1 g/dL   Globulin  2.9 1.9 - 3.7 g/dL (calc)   AG Ratio 1.7 1.0 - 2.5 (calc)   Total Bilirubin 0.3 0.2 - 1.2 mg/dL   Alkaline phosphatase (APISO) 45 35 - 144 U/L   AST 30 10 - 35 U/L   ALT 41 9 - 46 U/L  Hemoglobin A1c  Result Value Ref Range   Hgb A1c MFr Bld 5.3 <5.7 % of total  Hgb   Mean Plasma Glucose 105 (calc)   eAG (mmol/L) 5.8 (calc)  Lipid panel  Result Value Ref Range   Cholesterol 289 (H) <200 mg/dL   HDL 82 > OR = 40 mg/dL   Triglycerides 213 (H) <150 mg/dL   LDL Cholesterol (Calc) 169 (H) mg/dL (calc)   Total CHOL/HDL Ratio 3.5 <5.0 (calc)   Non-HDL Cholesterol (Calc) 207 (H) <130 mg/dL (calc)  PSA  Result Value Ref Range   PSA 1.18 < OR = 4.0 ng/mL  Uric acid  Result Value Ref Range   Uric Acid, Serum 8.4 (H) 4.0 - 8.0 mg/dL

## 2020-09-28 ENCOUNTER — Encounter: Payer: Self-pay | Admitting: Family Medicine

## 2020-09-28 ENCOUNTER — Other Ambulatory Visit: Payer: Self-pay

## 2020-09-28 ENCOUNTER — Ambulatory Visit (INDEPENDENT_AMBULATORY_CARE_PROVIDER_SITE_OTHER): Payer: Managed Care, Other (non HMO) | Admitting: Family Medicine

## 2020-09-28 VITALS — BP 132/82 | HR 100 | Resp 16 | Ht 67.0 in | Wt 169.0 lb

## 2020-09-28 DIAGNOSIS — M10072 Idiopathic gout, left ankle and foot: Secondary | ICD-10-CM

## 2020-09-28 DIAGNOSIS — Z131 Encounter for screening for diabetes mellitus: Secondary | ICD-10-CM | POA: Diagnosis not present

## 2020-09-28 DIAGNOSIS — Z130 Encounter for screening for diseases of the blood and blood-forming organs and certain disorders involving the immune mechanism: Secondary | ICD-10-CM

## 2020-09-28 DIAGNOSIS — Z0000 Encounter for general adult medical examination without abnormal findings: Secondary | ICD-10-CM | POA: Diagnosis not present

## 2020-09-28 DIAGNOSIS — Z125 Encounter for screening for malignant neoplasm of prostate: Secondary | ICD-10-CM

## 2020-09-28 DIAGNOSIS — Z13220 Encounter for screening for lipoid disorders: Secondary | ICD-10-CM

## 2020-09-28 LAB — COMPREHENSIVE METABOLIC PANEL: Alkaline phosphatase (APISO): 45 U/L (ref 35–144)

## 2020-09-28 MED ORDER — COLCHICINE 0.6 MG PO TABS: ORAL_TABLET | ORAL | 0 refills | Status: DC

## 2020-09-28 NOTE — Progress Notes (Signed)
Stable   Cologuard ordered for patient today upon request   No family hx of colon CA

## 2020-09-29 ENCOUNTER — Encounter: Payer: Self-pay | Admitting: Family Medicine

## 2020-09-29 LAB — URIC ACID: Uric Acid, Serum: 8.4 mg/dL — ABNORMAL HIGH (ref 4.0–8.0)

## 2020-09-29 LAB — COMPREHENSIVE METABOLIC PANEL
AG Ratio: 1.7 (calc) (ref 1.0–2.5)
ALT: 41 U/L (ref 9–46)
AST: 30 U/L (ref 10–35)
Albumin: 4.9 g/dL (ref 3.6–5.1)
BUN: 19 mg/dL (ref 7–25)
CO2: 28 mmol/L (ref 20–32)
Calcium: 9.9 mg/dL (ref 8.6–10.3)
Chloride: 97 mmol/L — ABNORMAL LOW (ref 98–110)
Creat: 0.84 mg/dL (ref 0.70–1.25)
Globulin: 2.9 g/dL (calc) (ref 1.9–3.7)
Glucose, Bld: 84 mg/dL (ref 65–99)
Potassium: 4.4 mmol/L (ref 3.5–5.3)
Sodium: 138 mmol/L (ref 135–146)
Total Bilirubin: 0.3 mg/dL (ref 0.2–1.2)
Total Protein: 7.8 g/dL (ref 6.1–8.1)

## 2020-09-29 LAB — CBC
HCT: 44.9 % (ref 38.5–50.0)
Hemoglobin: 15.9 g/dL (ref 13.2–17.1)
MCH: 36.7 pg — ABNORMAL HIGH (ref 27.0–33.0)
MCHC: 35.4 g/dL (ref 32.0–36.0)
MCV: 103.7 fL — ABNORMAL HIGH (ref 80.0–100.0)
MPV: 9.9 fL (ref 7.5–12.5)
Platelets: 224 10*3/uL (ref 140–400)
RBC: 4.33 10*6/uL (ref 4.20–5.80)
RDW: 13.5 % (ref 11.0–15.0)
WBC: 5.8 10*3/uL (ref 3.8–10.8)

## 2020-09-29 LAB — HEMOGLOBIN A1C
Hgb A1c MFr Bld: 5.3 % of total Hgb (ref <5.7)
Mean Plasma Glucose: 105 (calc)
eAG (mmol/L): 5.8 (calc)

## 2020-09-29 LAB — LIPID PANEL
Cholesterol: 289 mg/dL — ABNORMAL HIGH (ref <200)
HDL: 82 mg/dL (ref >=40)
LDL Cholesterol (Calc): 169 mg/dL (calc) — ABNORMAL HIGH
Non-HDL Cholesterol (Calc): 207 mg/dL (calc) — ABNORMAL HIGH (ref <130)
Total CHOL/HDL Ratio: 3.5 (calc) (ref <5.0)
Triglycerides: 213 mg/dL — ABNORMAL HIGH (ref <150)

## 2020-09-29 LAB — PSA: PSA: 1.18 ng/mL (ref <=4.0)

## 2020-10-24 ENCOUNTER — Other Ambulatory Visit: Payer: Self-pay | Admitting: Family Medicine

## 2020-10-24 DIAGNOSIS — M10072 Idiopathic gout, left ankle and foot: Secondary | ICD-10-CM

## 2020-11-07 ENCOUNTER — Telehealth: Payer: Self-pay | Admitting: Internal Medicine

## 2020-11-07 NOTE — Telephone Encounter (Signed)
Called and spoke with patient, advised that he would need an office visit to get his Trelegy refilled.  No openings for Dr. Sherene Sires until January, appointment scheduled for patient with [facility] for tomorrow at 4pm.  Patient advised to arrive by 3:45 for check in.  Advised of new location/address.  Nothing further needed.

## 2020-11-08 ENCOUNTER — Other Ambulatory Visit: Payer: Self-pay

## 2020-11-08 ENCOUNTER — Ambulatory Visit: Payer: Managed Care, Other (non HMO) | Admitting: Primary Care

## 2020-11-08 ENCOUNTER — Encounter: Payer: Self-pay | Admitting: Primary Care

## 2020-11-08 DIAGNOSIS — J449 Chronic obstructive pulmonary disease, unspecified: Secondary | ICD-10-CM

## 2020-11-08 MED ORDER — TRELEGY ELLIPTA 100-62.5-25 MCG/INH IN AEPB
INHALATION_SPRAY | RESPIRATORY_TRACT | 3 refills | Status: DC
Start: 2020-11-08 — End: 2021-11-14

## 2020-11-08 MED ORDER — ALBUTEROL SULFATE HFA 108 (90 BASE) MCG/ACT IN AERS: 2.0000 | INHALATION_SPRAY | RESPIRATORY_TRACT | 1 refills | Status: DC | PRN

## 2020-11-08 NOTE — Patient Instructions (Signed)
Nice meeting you today Jared [name]  Recommendations: -Continue Trelegy 1 puff daily in the morning (rinse mouth after use) - Use albuterol 2 puffs every 6 hours for breakthrough shortness of breath/chest tightness or wheezing  - Continue to exercise as you are   Rx: - Refill sent for both Trelegy and Albuterol  Follow-up: - 1 year with Dr. Sherene Sires   COPD and Physical Activity Chronic obstructive pulmonary disease (COPD) is a long-term (chronic) condition that affects the lungs. COPD is a general term that can be used to describe many different lung problems that cause lung swelling (inflammation) and limit airflow, including chronic bronchitis and emphysema. The main symptom of COPD is shortness of breath, which makes it harder to do even simple tasks. This can also make it harder to exercise and be active. Talk with your health care provider about treatments to help you breathe better and actions you can take to prevent breathing problems during physical activity. What are the benefits of exercising with COPD? Exercising regularly is an important part of a healthy lifestyle. You can still exercise and do physical activities even though you have COPD. Exercise and physical activity improve your shortness of breath by increasing blood flow (circulation). This causes your heart to pump more oxygen through your body. Moderate exercise can improve your:  Oxygen use.  Energy level.  Shortness of breath.  Strength in your breathing muscles.  Heart health.  Sleep.  Self-esteem and feelings of self-worth.  Depression, stress, and anxiety levels. Exercise can benefit everyone with COPD. The severity of your disease may affect how hard you can exercise, especially at first, but everyone can benefit. Talk with your health care provider about how much exercise is safe for you, and which activities and exercises are safe for you. What actions can I take to prevent breathing problems during  physical activity?  Sign up for a pulmonary rehabilitation program. This type of program may include: ? Education about lung diseases. ? Exercise classes that teach you how to exercise and be more active while improving your breathing. This usually involves:  Exercise using your lower extremities, such as a stationary bicycle.  About 30 minutes of exercise, 2 to 5 times per week, for 6 to 12 weeks  Strength training, such as push ups or leg lifts. ? Nutrition education. ? Group classes in which you can talk with others who also have COPD and learn ways to manage stress.  If you use an oxygen tank, you should use it while you exercise. Work with your health care provider to adjust your oxygen for your physical activity. Your resting flow rate is different from your flow rate during physical activity.  While you are exercising: ? Take slow breaths. ? Pace yourself and do not try to go too fast. ? Purse your lips while breathing out. Pursing your lips is similar to a kissing or whistling position. ? If doing exercise that uses a quick burst of effort, such as weight lifting:  Breathe in before starting the exercise.  Breathe out during the hardest part of the exercise (such as raising the weights). Where to find support You can find support for exercising with COPD from:  Your health care provider.  A pulmonary rehabilitation program.  Your local health department or community health programs.  Support groups, online or in-person. Your health care provider may be able to recommend support groups. Where to find more information You can find more information about exercising with COPD from:  American Lung Association: [place].hu.  COPD Foundation: AlmostHot.gl. Contact a health care provider if:  Your symptoms get worse.  You have chest pain.  You have nausea.  You have a fever.  You have trouble talking or catching your breath.  You want to start a new exercise  program or a new activity. Summary  COPD is a general term that can be used to describe many different lung problems that cause lung swelling (inflammation) and limit airflow. This includes chronic bronchitis and emphysema.  Exercise and physical activity improve your shortness of breath by increasing blood flow (circulation). This causes your heart to provide more oxygen to your body.  Contact your health care provider before starting any exercise program or new activity. Ask your health care provider what exercises and activities are safe for you. This information is not intended to replace advice given to you by your health care provider. Make sure you discuss any questions you have with your health care provider. Document Revised: 03/17/2019 Document Reviewed: 12/18/2017 Elsevier Patient Education  2020 [employer].

## 2020-11-08 NOTE — Progress Notes (Addendum)
@Patient  ID: , male    DOB: 1956/09/02, 64 y.o.   MRN: [id number]  Chief Complaint  Patient presents with  . Follow-up    Patient is feeling good overall, exercising everyday, no concerns    Referring provider: Copland, [id number], MD  HPI: 64 year old male, former smoker quit in 2003 (40-pack-year history).  COPD Gold 3 with reversibility.  Patient of Dr. 2004, last seen in office on 09/03/2019.  PFTs on 08/27/2016 showed an FEV1 1.32 (38%), ratio 50.  He was started on Trelegy on 09/01/2018.   11/08/2020- interim hx Presents today for 1 year follow-up, needing med refill.  He is doing well with no acute complaints. He is compliant with Trelegy. He has not needed to use rescue inhaler in the last year. Five days a week he walks on treadmill for 30 mins without difficulty. He rinses his mouth after using inhaler. He does not irritation to hard palate. Nothing irregular noted on exam today. Recommend he continue to monitor and follow up with DMD. Denies significant shortness of breath, cough, chest tightness or wheezing.   Symptoms: none Saba use: none Nocturnal symptoms: none  Oxygen: none  Allergies  Allergen Reactions  . Aspirin   . Bupropion Hcl   . Penicillins Hives    Immunization History  Administered Date(s) Administered  . Pneumococcal Polysaccharide-23 03/09/2008  . Tdap 10/06/2017    Past Medical History:  Diagnosis Date  . COPD (chronic obstructive pulmonary disease) (HCC)   . Rhinitis     Tobacco History: Social History   Tobacco Use  Smoking Status Former Smoker  . Packs/day: 2.00  . Years: 20.00  . Pack years: 40.00  . Types: Cigarettes  . Quit date: 11/08/2002  . Years since quitting: 18.0  Smokeless Tobacco Never Used   Counseling given: Not Answered   Outpatient Medications Prior to Visit  Medication Sig Dispense Refill  . colchicine 0.6 MG tablet TAKE 2 PILLS, THEN 1 AN HOUR LATER- REPEAT REGIMEN EVERY FEW DAYS AS NEEDED FOR GOUT  ATTACK 30 tablet 0  . albuterol (PROVENTIL HFA) 108 (90 Base) MCG/ACT inhaler Inhale 2 puffs into the lungs every 4 (four) hours as needed for wheezing or shortness of breath. Please specify directions, refills and quantity 1 Inhaler 5  . TRELEGY ELLIPTA 100-62.5-25 MCG/INH AEPB TAKE 1 PUFF BY MOUTH EVERY DAY 180 each 0   No facility-administered medications prior to visit.   Review of Systems  Review of Systems  Constitutional: Negative.   HENT: Negative.   Respiratory: Negative for cough, chest tightness, shortness of breath and wheezing.    Physical Exam  BP 138/82 (BP Location: Left Arm, Patient Position: Sitting, Cuff Size: Normal)   Pulse (!) 104   Temp (!) 97.1 F (36.2 C) (Temporal)   Ht 5\' 7"  (1.702 m)   Wt 168 lb 12.8 oz (76.6 kg)   SpO2 93%   BMI 26.44 kg/m  Physical Exam Constitutional:      Appearance: Normal appearance.  HENT:     Head: Normocephalic and atraumatic.     Mouth/Throat:     Mouth: Mucous membranes are moist.     Pharynx: Oropharynx is clear. No oropharyngeal exudate or posterior oropharyngeal erythema.     Comments: Nothing irregular noted on oral exam Cardiovascular:     Rate and Rhythm: Normal rate and regular rhythm.  Pulmonary:     Effort: Pulmonary effort is normal.     Breath sounds: Normal breath sounds.  Neurological:  General: No focal deficit present.     Mental Status: He is alert and oriented to person, place, and time. Mental status is at baseline.  Psychiatric:        Mood and Affect: Mood normal.        Behavior: Behavior normal.        Thought Content: Thought content normal.        Judgment: Judgment normal.      Lab Results:  CBC    Component Value Date/Time   WBC 5.8 09/28/2020 0857   RBC 4.33 09/28/2020 0857   HGB 15.9 09/28/2020 0857   HCT 44.9 09/28/2020 0857   PLT 224 09/28/2020 0857   MCV 103.7 (H) 09/28/2020 0857   MCV 96.5 09/26/2016 1121   MCH 36.7 (H) 09/28/2020 0857   MCHC 35.4 09/28/2020 0857    RDW 13.5 09/28/2020 0857   LYMPHSABS 1.8 07/10/2010 1632   MONOABS 0.7 07/10/2010 1632   EOSABS 0.1 07/10/2010 1632   BASOSABS 0.1 07/10/2010 1632    BMET    Component Value Date/Time   NA 138 09/28/2020 0857   K 4.4 09/28/2020 0857   CL 97 (L) 09/28/2020 0857   CO2 28 09/28/2020 0857   GLUCOSE 84 09/28/2020 0857   BUN 19 09/28/2020 0857   CREATININE 0.84 09/28/2020 0857   CALCIUM 9.9 09/28/2020 0857   GFRNONAA 91.95 07/10/2010 1632    BNP No results found for: BNP  ProBNP No results found for: PROBNP  Imaging: No results found.   Assessment & Plan:   COPD GOLD III with reversibility  - Adequately controlled on present treatment. No recent exacerbations. No SABA use.  - Continue Trelegy 100 one puff daily, prn albuterol hfa. Refills sent to pharmacy - Follow-up in 1 year with Dr. York Ram, NP 11/08/2020

## 2020-11-08 NOTE — Assessment & Plan Note (Signed)
-   Adequately controlled on present treatment. No recent exacerbations. No SABA use.  - Continue Trelegy 100 one puff daily, prn albuterol hfa. Refills sent to pharmacy - Follow-up in 1 year with Dr. Sherene Sires

## 2020-11-09 ENCOUNTER — Other Ambulatory Visit: Payer: Self-pay | Admitting: Family Medicine

## 2020-11-09 DIAGNOSIS — M10072 Idiopathic gout, left ankle and foot: Secondary | ICD-10-CM

## 2021-01-05 ENCOUNTER — Other Ambulatory Visit: Payer: Self-pay | Admitting: Primary Care

## 2021-05-28 ENCOUNTER — Other Ambulatory Visit: Payer: Self-pay | Admitting: Internal Medicine

## 2021-06-16 NOTE — Progress Notes (Addendum)
[facility] [place] [address], [address] [place], [place] [address] [phone] Fax [phone]  Date:  06/22/2021   Name:  Jared [name]   DOB:  01/18/1956   MRN:  [id number]  PCP:  Copland, Jessica C, MD    Chief Complaint: left flank discomfort (Going on 4 months )   History of Present Illness:  Jared [name] is a 64 y.o. very pleasant male patient who presents with the following:  Pt seen today for a follow-up visit- history of COPD  Last seen by myself in October  Last visit with pulmonology in December  He has a 40 pack year history   Covid series-not done Shingrix-not done Colon cancer screening-overdue, he did not complete his Cologuard kit.  On discussion he would prefer to have a colonoscopy Lung cancer CT -he no longer qualifies as he stopped smoking greater than 15 years ago Labs done in October - PSA done then, showed increase- can recheck today   Pt notes a daily, persistent pressure/ pain in his left flank It is getting a bit worse Present for about 3-4 months He describes as a "pre-cramp"- he does tend to get muscle cramps in his feet and legs sometimes, this pain feels somewhat like a cramp might be coming He has increased his electrolyte intake recently but it did not seem to help The sx are always on the left side, right side is normal He notes that it seems to feel better if he stretches the muscles on that side No weight loss  He drinks approximately 6 ounces of liquor daily  He had a splenic injury as a pre-teen; he injured his belly while sledding.  He notes he was observed in the hospital overnight.  He feels like his pain is in the same general region this time He has not noted any change in his bowel habits -no nausea, vomiting, constipation  Wt Readings from Last 3 Encounters:  06/22/21 174 lb 9.6 oz (79.2 kg)  11/08/20 168 lb 12.8 oz (76.6 kg)  09/28/20 169 lb (76.7 kg)   No fevers or chills No urinary sx     Lab Results  Component Value Date   PSA 1.18 09/28/2020   PSA 0.29 10/06/2017     Patient Active Problem List   Diagnosis Date Noted   Candidiasis, intertrigo 09/03/2019   Asthma 09/26/2016   RHINITIS 10/27/2007   COPD GOLD III with reversibility  10/27/2007    Past Medical History:  Diagnosis Date   COPD (chronic obstructive pulmonary disease) (HCC)    Rhinitis     No past surgical history on file.  Social History   Tobacco Use   Smoking status: Former    Packs/day: 2.00    Years: 20.00    Pack years: 40.00    Types: Cigarettes    Quit date: 11/08/2002    Years since quitting: 18.6   Smokeless tobacco: Never  Vaping Use   Vaping Use: Never used  Substance Use Topics   Alcohol use: No   Drug use: No    Family History  Problem Relation Age of Onset   Emphysema Mother        smoked   Lung cancer Mother        smoked    Allergies  Allergen Reactions   Aspirin    Bupropion Hcl    Penicillins Hives    Medication list has been reviewed and updated.  Current Outpatient Medications   on File Prior to Visit  Medication Sig Dispense Refill   albuterol (VENTOLIN HFA) 108 (90 Base) MCG/ACT inhaler INHALE 2 PUFFS BY MOUTH EVERY 4 HOURS AS NEEDED FOR WHEEZE OR FOR SHORTNESS OF BREATH 6.7 each 3   Fluticasone-Umeclidin-Vilant (TRELEGY ELLIPTA) 100-62.5-25 MCG/INH AEPB TAKE 1 PUFF BY MOUTH EVERY DAY 180 each 3   No current facility-administered medications on file prior to visit.    Review of Systems:  As per HPI- otherwise negative. BP Readings from Last 3 Encounters:  06/22/21 (!) 148/88  11/08/20 138/82  09/28/20 132/82    Physical Examination: Vitals:   06/22/21 0854 06/22/21 0908  BP: (!) 154/88 (!) 148/88  Pulse: 100   Temp: 98.2 F (36.8 C)   SpO2: 97%    Vitals:   06/22/21 0854  Weight: 174 lb 9.6 oz (79.2 kg)  Height: 5' 7" (1.702 m)   Body mass index is 27.35 kg/m. Ideal Body Weight: Weight in (lb) to have BMI = 25: 159.3  GEN:  no acute distress.  Mild overweight, looks well HEENT: Atraumatic, Normocephalic.  Ears and Nose: No external deformity. CV: RRR, No M/G/R. No JVD. No thrill. No extra heart sounds. PULM: CTA B, no wheezes, crackles, rhonchi. No retractions. No resp. distress. No accessory muscle use. ABD: S, NT, ND, +BS. No rebound. No HSM.  I am somewhat able to reproduce his discomfort by palpating the oblique muscles of the left side abdomen.  No masses are noted, no bruising or skin changes.  The ribs do not seem tender EXTR: No c/c/e PSYCH: Normally interactive. Conversant.    Assessment and Plan: Left flank pain - Plan: CBC, Comprehensive metabolic panel, Urine Culture, POCT urinalysis dipstick  Former smoker - Plan: DG Chest 2 View  Increased prostate specific antigen (PSA) velocity - Plan: PSA  Screening for colon cancer - Plan: Ambulatory referral to Gastroenterology  Acute idiopathic gout of left foot - Plan: colchicine 0.6 MG tablet  Elevated blood pressure reading  Patient here today with concern of left flank pain.  This is been present for 3 to 4 months.  He is at increased risk of cancer due to history of heavy tobacco and alcohol use. No respiratory symptoms at this time.  We will obtain a chest film. Labs pending as above.  Assuming renal function is adequate we will plan to proceed with a CT abdomen pelvis Referral placed to GI for colon cancer screening Follow-up on PSA today due to rising PSA at last check Refilled colchicine which he uses for your gout attack Blood pressure is elevated today, it is typically normal.  We will plan follow-up  This visit occurred during the SARS-CoV-2 public health emergency.  Safety protocols were in place, including screening questions prior to the visit, additional usage of staff PPE, and extensive cleaning of exam room while observing appropriate contact time as indicated for disinfecting solutions.   Signed Lamar Blinks, MD  Results for  orders placed or performed in visit on 06/22/21  POCT urinalysis dipstick  Result Value Ref Range   Color, UA yellow yellow   Clarity, UA clear clear   Glucose, UA negative negative mg/dL   Bilirubin, UA negative negative   Ketones, POC UA large (80) (A) negative mg/dL   Spec Grav, UA 1.020 1.010 - 1.025   Blood, UA negative negative   pH, UA 6.0 5.0 - 8.0   Protein Ur, POC =30 (A) negative mg/dL   Urobilinogen, UA 0.2 0.2 or 1.0 E.U./dL  Nitrite, UA Negative Negative   Leukocytes, UA Negative Negative    DG Chest 2 View  Result Date: 06/22/2021 CLINICAL DATA:  Left flank pain, former smoker EXAM: CHEST - 2 VIEW COMPARISON:  09/26/2016 FINDINGS: The heart size and mediastinal contours are within normal limits. Both lungs are clear. The visualized skeletal structures are unremarkable. IMPRESSION: Negative Electronically Signed   By: Ra  Dover M.D.   On: 06/22/2021 10:12    Received his labs, message to pt Results for orders placed or performed in visit on 06/22/21  CBC  Result Value Ref Range   WBC 5.3 4.0 - 10.5 K/uL   RBC 4.24 4.22 - 5.81 Mil/uL   Platelets 223.0 150.0 - 400.0 K/uL   Hemoglobin 15.2 13.0 - 17.0 g/dL   HCT 44.3 39.0 - 52.0 %   MCV 104.5 (H) 78.0 - 100.0 fl   MCHC 34.3 30.0 - 36.0 g/dL   RDW 14.1 11.5 - 15.5 %  Comprehensive metabolic panel  Result Value Ref Range   Sodium 138 135 - 145 mEq/L   Potassium 4.6 3.5 - 5.1 mEq/L   Chloride 96 96 - 112 mEq/L   CO2 29 19 - 32 mEq/L   Glucose, Bld 79 70 - 99 mg/dL   BUN 19 6 - 23 mg/dL   Creatinine, Ser 0.82 0.40 - 1.50 mg/dL   Total Bilirubin 0.3 0.2 - 1.2 mg/dL   Alkaline Phosphatase 47 39 - 117 U/L   AST 31 0 - 37 U/L   ALT 37 0 - 53 U/L   Total Protein 7.9 6.0 - 8.3 g/dL   Albumin 4.9 3.5 - 5.2 g/dL   GFR 94.08 >60.00 mL/min   Calcium 9.7 8.4 - 10.5 mg/dL  PSA  Result Value Ref Range   PSA 0.20 0.10 - 4.00 ng/mL  POCT urinalysis dipstick  Result Value Ref Range   Color, UA yellow yellow    Clarity, UA clear clear   Glucose, UA negative negative mg/dL   Bilirubin, UA negative negative   Ketones, POC UA large (80) (A) negative mg/dL   Spec Grav, UA 1.020 1.010 - 1.025   Blood, UA negative negative   pH, UA 6.0 5.0 - 8.0   Protein Ur, POC =30 (A) negative mg/dL   Urobilinogen, UA 0.2 0.2 or 1.0 E.U./dL   Nitrite, UA Negative Negative   Leukocytes, UA Negative Negative      

## 2021-06-22 ENCOUNTER — Other Ambulatory Visit: Payer: Self-pay

## 2021-06-22 ENCOUNTER — Ambulatory Visit (HOSPITAL_BASED_OUTPATIENT_CLINIC_OR_DEPARTMENT_OTHER)
Admission: RE | Admit: 2021-06-22 | Discharge: 2021-06-22 | Disposition: A | Discharge status: Home / Self Care | Payer: BC Managed Care – PPO | Source: Ambulatory Visit | Attending: Family Medicine | Admitting: Family Medicine

## 2021-06-22 ENCOUNTER — Encounter: Payer: Self-pay | Admitting: Family Medicine

## 2021-06-22 ENCOUNTER — Ambulatory Visit (INDEPENDENT_AMBULATORY_CARE_PROVIDER_SITE_OTHER): Payer: BC Managed Care – PPO | Admitting: Family Medicine

## 2021-06-22 VITALS — BP 148/88 | HR 100 | Temp 98.2°F | Ht 67.0 in | Wt 174.6 lb

## 2021-06-22 DIAGNOSIS — Z87891 Personal history of nicotine dependence: Secondary | ICD-10-CM | POA: Insufficient documentation

## 2021-06-22 DIAGNOSIS — R030 Elevated blood-pressure reading, without diagnosis of hypertension: Secondary | ICD-10-CM

## 2021-06-22 DIAGNOSIS — M10072 Idiopathic gout, left ankle and foot: Secondary | ICD-10-CM

## 2021-06-22 DIAGNOSIS — Z1211 Encounter for screening for malignant neoplasm of colon: Secondary | ICD-10-CM | POA: Diagnosis not present

## 2021-06-22 DIAGNOSIS — R972 Elevated prostate specific antigen [PSA]: Secondary | ICD-10-CM | POA: Diagnosis not present

## 2021-06-22 DIAGNOSIS — R109 Unspecified abdominal pain: Secondary | ICD-10-CM | POA: Diagnosis not present

## 2021-06-22 DIAGNOSIS — R1010 Upper abdominal pain, unspecified: Secondary | ICD-10-CM

## 2021-06-22 LAB — POCT URINALYSIS DIP (MANUAL ENTRY)
Bilirubin, UA: NEGATIVE
Blood, UA: NEGATIVE
Glucose, UA: NEGATIVE mg/dL
Leukocytes, UA: NEGATIVE
Nitrite, UA: NEGATIVE
Protein Ur, POC: 30 mg/dL — AB
Spec Grav, UA: 1.02 (ref 1.010–1.025)
Urobilinogen, UA: 0.2 E.U./dL
pH, UA: 6 (ref 5.0–8.0)

## 2021-06-22 LAB — COMPREHENSIVE METABOLIC PANEL
ALT: 37 U/L (ref 0–53)
AST: 31 U/L (ref 0–37)
Albumin: 4.9 g/dL (ref 3.5–5.2)
Alkaline Phosphatase: 47 U/L (ref 39–117)
BUN: 19 mg/dL (ref 6–23)
CO2: 29 mEq/L (ref 19–32)
Calcium: 9.7 mg/dL (ref 8.4–10.5)
Chloride: 96 mEq/L (ref 96–112)
Creatinine, Ser: 0.82 mg/dL (ref 0.40–1.50)
GFR: 94.08 mL/min (ref >60.00)
Glucose, Bld: 79 mg/dL (ref 70–99)
Potassium: 4.6 mEq/L (ref 3.5–5.1)
Sodium: 138 mEq/L (ref 135–145)
Total Bilirubin: 0.3 mg/dL (ref 0.2–1.2)
Total Protein: 7.9 g/dL (ref 6.0–8.3)

## 2021-06-22 LAB — CBC
HCT: 44.3 % (ref 39.0–52.0)
Hemoglobin: 15.2 g/dL (ref 13.0–17.0)
MCHC: 34.3 g/dL (ref 30.0–36.0)
MCV: 104.5 fl — ABNORMAL HIGH (ref 78.0–100.0)
Platelets: 223 10*3/uL (ref 150.0–400.0)
RBC: 4.24 Mil/uL (ref 4.22–5.81)
RDW: 14.1 % (ref 11.5–15.5)
WBC: 5.3 10*3/uL (ref 4.0–10.5)

## 2021-06-22 LAB — PSA: PSA: 0.2 ng/mL (ref 0.10–4.00)

## 2021-06-22 MED ORDER — COLCHICINE 0.6 MG PO TABS: ORAL_TABLET | ORAL | 3 refills | Status: DC

## 2021-06-22 NOTE — Addendum Note (Signed)
Addended by: Abbe Amsterdam C on: 06/22/2021 05:21 PM   Modules accepted: Orders

## 2021-06-22 NOTE — Patient Instructions (Addendum)
I will be in touch with your labs and your chest x-ray report asap Assuming no other new information, we will then plan to get a CT of your abdomen/ pelvis We also need to get you screened for colon cancer- we will get you set up to see GI

## 2021-06-24 LAB — URINE CULTURE
MICRO NUMBER:: 12124737
Result:: NO GROWTH
SPECIMEN QUALITY:: ADEQUATE

## 2021-07-03 ENCOUNTER — Other Ambulatory Visit: Payer: Self-pay | Admitting: Family Medicine

## 2021-07-03 DIAGNOSIS — M10072 Idiopathic gout, left ankle and foot: Secondary | ICD-10-CM

## 2021-07-05 ENCOUNTER — Encounter (HOSPITAL_BASED_OUTPATIENT_CLINIC_OR_DEPARTMENT_OTHER): Payer: Self-pay

## 2021-07-05 ENCOUNTER — Other Ambulatory Visit: Payer: Self-pay

## 2021-07-05 ENCOUNTER — Encounter: Payer: Self-pay | Admitting: Family Medicine

## 2021-07-05 ENCOUNTER — Ambulatory Visit (HOSPITAL_BASED_OUTPATIENT_CLINIC_OR_DEPARTMENT_OTHER): Payer: BC Managed Care – PPO

## 2021-07-05 ENCOUNTER — Ambulatory Visit (HOSPITAL_BASED_OUTPATIENT_CLINIC_OR_DEPARTMENT_OTHER)
Admission: RE | Admit: 2021-07-05 | Discharge: 2021-07-05 | Disposition: A | Discharge status: Home / Self Care | Payer: BC Managed Care – PPO | Source: Ambulatory Visit | Attending: Family Medicine | Admitting: Family Medicine

## 2021-07-05 DIAGNOSIS — R1010 Upper abdominal pain, unspecified: Secondary | ICD-10-CM | POA: Diagnosis not present

## 2021-07-05 DIAGNOSIS — R109 Unspecified abdominal pain: Secondary | ICD-10-CM | POA: Insufficient documentation

## 2021-07-05 DIAGNOSIS — K760 Fatty (change of) liver, not elsewhere classified: Secondary | ICD-10-CM | POA: Diagnosis not present

## 2021-07-05 DIAGNOSIS — R10A2 Flank pain, left side: Secondary | ICD-10-CM

## 2021-07-05 MED ORDER — IOHEXOL 300 MG/ML  SOLN
75.0000 mL | Freq: Once | INTRAMUSCULAR | Status: AC | PRN
  Administered 2021-07-05: 75 mL via INTRAVENOUS

## 2021-09-19 ENCOUNTER — Other Ambulatory Visit: Payer: Self-pay | Admitting: Internal Medicine

## 2021-09-25 DIAGNOSIS — F4322 Adjustment disorder with anxiety: Secondary | ICD-10-CM | POA: Diagnosis not present

## 2021-10-03 DIAGNOSIS — F4322 Adjustment disorder with anxiety: Secondary | ICD-10-CM | POA: Diagnosis not present

## 2021-10-10 DIAGNOSIS — F4322 Adjustment disorder with anxiety: Secondary | ICD-10-CM | POA: Diagnosis not present

## 2021-10-17 DIAGNOSIS — F4322 Adjustment disorder with anxiety: Secondary | ICD-10-CM | POA: Diagnosis not present

## 2021-10-24 DIAGNOSIS — F4322 Adjustment disorder with anxiety: Secondary | ICD-10-CM | POA: Diagnosis not present

## 2021-11-07 DIAGNOSIS — F4322 Adjustment disorder with anxiety: Secondary | ICD-10-CM | POA: Diagnosis not present

## 2021-11-14 ENCOUNTER — Other Ambulatory Visit: Payer: Self-pay

## 2021-11-14 ENCOUNTER — Ambulatory Visit: Payer: BC Managed Care – PPO | Admitting: Internal Medicine

## 2021-11-14 ENCOUNTER — Encounter: Payer: Self-pay | Admitting: Internal Medicine

## 2021-11-14 DIAGNOSIS — J449 Chronic obstructive pulmonary disease, unspecified: Secondary | ICD-10-CM

## 2021-11-14 DIAGNOSIS — F4322 Adjustment disorder with anxiety: Secondary | ICD-10-CM | POA: Diagnosis not present

## 2021-11-14 DIAGNOSIS — R0789 Other chest pain: Secondary | ICD-10-CM | POA: Insufficient documentation

## 2021-11-14 MED ORDER — FLUTICASONE-UMECLIDIN-VILANT 100-62.5-25 MCG/ACT IN AEPB: INHALATION_SPRAY | RESPIRATORY_TRACT | 3 refills | Status: DC

## 2021-11-14 NOTE — Progress Notes (Signed)
Subjective:     Patient ID: Jared [name], male   DOB: [birth year]  MRN: [id number]    Brief patient profile:  [id number]  MM/quit smoking 2003 with GOLD III copd with reversible component.     History of Present Illness  03/23/2013 f/u ov/Jared [name] re copd/ab Chief Complaint  Patient presents with   COPD    breathing unchanged.   using albuterol avg once a day, rarely exercises but does fine albtuerol helps when he uses it, no cramps from albuterol. rec Add singulair 10 mg one each pm for the next month to see if activity tolerance improves or need for albuterol declines if not stop the singulair and use the albuterol more aggressively, especially before exertion.      11/05/2013 f/u ov/Jared [name] re: copd/ ab flare, did not restart singulair  Chief Complaint  Patient presents with   Acute Visit    Pt c/o increased SOB for the past wk- esp worse at bedtime and first thing in the am. He also c/o wheezing and occ prod cough with minimal green to yellow sputum.   until 2 weeks prior did not note any increase alb but x 2 weeks much more saba, esp in cold weather,  last dose 12 h prior to OV , min am discolored sputum. rec Plan A = automatic Add back  singulair 10 mg each pm to your spiriva and qvar you are already on Prednisone 10 mg take  4 each am x 2 days,   2 each am x 2 days,  1 each am x 2 days and stop  Plan B= backup = Only use your albuterol(proair) as a rescue medication      09/01/2018  f/u ov/Jared [name] re: copd III/ breo daily / rare saba  Chief Complaint  Patient presents with   Follow-up    Breathing is doing well. He rarely uses his albuterol inhaler. He notices occ wheezing.   Dyspnea:  MMRC1 = can walk nl pace, flat grade, can't hurry or go uphills or steps s sob   Cough: none Sleeping: flat/ one pillow SABA use: rarely need  02: none   Rec try trelegy     11/14/2021  f/u ov/Jared [name] re: copd 3  maint on trelegy  s aecopd  Chief Complaint  Patient presents with    Follow-up    1 yr f/u  Dyspnea:  teadmill nighty s sob flat grade speed register is broken  daily  Cough: none  Sleeping: flat bed one pillow  SABA use: rarely  02: none  Covid status:   never vax,  omicron  New discomfort LUQ only in upright positions, crampy x one y neg CT abd    No obvious day to day or daytime variability or assoc excess/ purulent sputum or mucus plugs or hemoptysis or chest tightness, subjective wheeze or overt sinus or hb symptoms.   Sleeping  without nocturnal  or early am exacerbation  of respiratory  c/o's or need for noct saba. Also denies any obvious fluctuation of symptoms with weather or environmental changes or other aggravating or alleviating factors except as outlined above   No unusual exposure hx or h/o childhood pna/ asthma or knowledge of premature birth.  Current Allergies, Complete Past Medical History, Past Surgical History, Family History, and Social History were reviewed in [employer] record.  ROS  The following are not active complaints unless bolded Hoarseness, sore throat, dysphagia, dental problems, itching, sneezing,  nasal congestion or discharge of  excess mucus or purulent secretions, ear ache,   fever, chills, sweats, unintended wt loss or wt gain, classically pleuritic or exertional cp,  orthopnea pnd or arm/hand swelling  or leg swelling, presyncope, palpitations, abdominal pain, anorexia, nausea, vomiting, diarrhea  or change in bowel habits or change in bladder habits, change in stools or change in urine, dysuria, hematuria,  rash, arthralgias, visual complaints, headache, numbness, weakness or ataxia or problems with walking or coordination,  change in mood or  memory.        Current Meds  Medication Sig   albuterol (VENTOLIN HFA) 108 (90 Base) MCG/ACT inhaler INHALE 2 PUFFS BY MOUTH EVERY 4 HOURS AS NEEDED FOR WHEEZE OR FOR SHORTNESS OF BREATH   colchicine 0.6 MG tablet TAKE 2 PILLS, THEN 1 AN HOUR LATER- REPEAT  REGIMEN EVERY FEW DAYS AS NEEDED FOR GOUT ATTACK   Fluticasone-Umeclidin-Vilant (TRELEGY ELLIPTA) 100-62.5-25 MCG/INH AEPB TAKE 1 PUFF BY MOUTH EVERY DAY                        Objective:   Physical Exam    Wts: 11/14/2021   177  09/01/2018   158  08/29/2017   165   07/14/2014    180  03/03/2014   192  12/31/2011  196   3/302010    187     Vital signs reviewed  11/14/2021  - Note at rest 02 sats  96% on RA   General appearance:    amb wm nad   HEENT : pt wearing mask not removed for exam due to covid -19 concerns.    NECK :  without JVD/Nodes/TM/ nl carotid upstrokes bilaterally   LUNGS: no acc muscle use,  Mod barrel  contour chest wall with bilateral  Distant bs s audible wheeze and  without cough on insp or exp maneuvers and mod  Hyperresonant  to  percussion bilaterally     CV:  RRR  no s3 or murmur or increase in P2, and no edema   ABD:  mod obese/ soft and nontender with pos mid insp Hoover's  in the supine position. No bruits or organomegaly appreciated, bowel sounds nl  MS:     ext warm without deformities, calf tenderness, cyanosis or clubbing No obvious joint restrictions   SKIN: warm and dry without lesions    NEURO:  alert, approp, nl sensorium with  no motor or cerebellar deficits apparent.             I personally reviewed images and agree with radiology impression as follows:  CXR:   PA and Lat  06/22/21 Negative         Assessment:

## 2021-11-14 NOTE — Assessment & Plan Note (Signed)
Onset 2021 only in upright position - CT abd  07/05/21 neg with lower chest also nl  - 11/14/2021 rec rx as IBS and GI f/u prn   Classic subdiaphragmatic pain pattern suggests ibs:  Stereotypical, migratory with a very limited distribution of pain locations, daytime, not usually exacerbated by exercise  or coughing, worse in sitting position, frequently associated with generalized abd bloating, not as likely to be present supine due to the dome effect of the diaphragm which  is  canceled in that position. Frequently these patients have had multiple negative GI workups and CT scans.  Treatment consists of avoiding foods that cause gas (especially boiled eggs, mexcican food but especially  beans and undercooked vegetables like  spinach and some salads)  and citrucel 1 heaping tsp twice daily with a large glass of water.  Pain should improve w/in 2 weeks and if not then consider further GI work up.             Each maintenance medication was reviewed in detail including emphasizing most importantly the difference between maintenance and prns and under what circumstances the prns are to be triggered using an action plan format where appropriate.  Total time for H and P, chart review, counseling, reviewing hfa/dpi device(s) and generating customized AVS unique to this office visit / same day charting = 33 min

## 2021-11-14 NOTE — Patient Instructions (Signed)
Classic subdiaphragmatic pain pattern suggests ibs:  Stereotypical, migratory with a very limited distribution of pain locations, daytime, not usually exacerbated by exercise  or coughing, worse in sitting position, frequently associated with generalized abd bloating, not as likely to be present supine due to the dome effect of the diaphragm which  is  canceled in that position. Frequently these patients have had multiple negative GI workups and CT scans.  Treatment consists of avoiding foods that cause gas (especially boiled eggs, [place] food but especially  beans and undercooked vegetables like  spinach and some salads)  and citrucel 1 heaping tsp twice daily with a large glass of water.  Pain should improve w/in 2 weeks and if not then consider further GI work up.      Please schedule a follow up visit in 12 months but call sooner if needed

## 2021-11-14 NOTE — Assessment & Plan Note (Signed)
Quit smoking 2003  - alpha One AT level 137  01/25/03> genotype   03/23/13 =  MM - PFT's 03/2007 FEV 67% with ratio of 53 and DLC0 98%  - PFT's 03/23/2013  FEV 1.25 (38%) ratio of 40 and 34% p B2 to FEV1 of 51%  and DLCO 97% - PFTs 03/03/2014   FEV 1.29(36%) ratio 48 with 32% better p B2 to FEV1 48% and DLCO 103%  - HFA 100% 12/31/2011  - Singulair started 03/23/13 > > off as of 09/21/13  > worse 11/05/2013 so restarted  - trial of Breo 03/03/2014 > improved 07/14/2014 s cramping > f/u yearly  - 08/22/2015 trial off spiriva > no worse  - PFT's  08/27/2016  FEV1 1.32  (38 % ) ratio 50  p 4 % improvement from saba p Breo 100 prior to study with DLCO  96/101 % corrects to 121 % for alv volume   -trial of trelegy 09/01/2018 >>> improved as of televisit 09/03/2019 > refill x one year.   Group D in terms of symptom/risk and laba/lama/ICS  therefore appropriate rx at this point >>>  trelegy 100 each am and prn saba

## 2021-11-21 DIAGNOSIS — F4322 Adjustment disorder with anxiety: Secondary | ICD-10-CM | POA: Diagnosis not present

## 2021-11-28 DIAGNOSIS — F4322 Adjustment disorder with anxiety: Secondary | ICD-10-CM | POA: Diagnosis not present

## 2021-12-05 DIAGNOSIS — F4322 Adjustment disorder with anxiety: Secondary | ICD-10-CM | POA: Diagnosis not present

## 2021-12-12 DIAGNOSIS — F4322 Adjustment disorder with anxiety: Secondary | ICD-10-CM | POA: Diagnosis not present

## 2021-12-19 DIAGNOSIS — F4322 Adjustment disorder with anxiety: Secondary | ICD-10-CM | POA: Diagnosis not present

## 2021-12-26 DIAGNOSIS — F4322 Adjustment disorder with anxiety: Secondary | ICD-10-CM | POA: Diagnosis not present

## 2022-01-02 DIAGNOSIS — F4322 Adjustment disorder with anxiety: Secondary | ICD-10-CM | POA: Diagnosis not present

## 2022-01-16 DIAGNOSIS — F4322 Adjustment disorder with anxiety: Secondary | ICD-10-CM | POA: Diagnosis not present

## 2022-01-22 ENCOUNTER — Other Ambulatory Visit: Payer: Self-pay | Admitting: Internal Medicine

## 2022-01-23 DIAGNOSIS — F4322 Adjustment disorder with anxiety: Secondary | ICD-10-CM | POA: Diagnosis not present

## 2022-02-06 DIAGNOSIS — F4322 Adjustment disorder with anxiety: Secondary | ICD-10-CM | POA: Diagnosis not present

## 2022-02-20 DIAGNOSIS — F4322 Adjustment disorder with anxiety: Secondary | ICD-10-CM | POA: Diagnosis not present

## 2022-03-06 DIAGNOSIS — F4322 Adjustment disorder with anxiety: Secondary | ICD-10-CM | POA: Diagnosis not present

## 2022-03-20 DIAGNOSIS — F4322 Adjustment disorder with anxiety: Secondary | ICD-10-CM | POA: Diagnosis not present

## 2022-04-03 DIAGNOSIS — F4322 Adjustment disorder with anxiety: Secondary | ICD-10-CM | POA: Diagnosis not present

## 2022-04-17 DIAGNOSIS — F4322 Adjustment disorder with anxiety: Secondary | ICD-10-CM | POA: Diagnosis not present

## 2022-05-01 DIAGNOSIS — F4322 Adjustment disorder with anxiety: Secondary | ICD-10-CM | POA: Diagnosis not present

## 2022-05-15 DIAGNOSIS — F4322 Adjustment disorder with anxiety: Secondary | ICD-10-CM | POA: Diagnosis not present

## 2022-05-29 DIAGNOSIS — F4322 Adjustment disorder with anxiety: Secondary | ICD-10-CM | POA: Diagnosis not present

## 2022-06-12 DIAGNOSIS — F4322 Adjustment disorder with anxiety: Secondary | ICD-10-CM | POA: Diagnosis not present

## 2022-07-10 DIAGNOSIS — F4322 Adjustment disorder with anxiety: Secondary | ICD-10-CM | POA: Diagnosis not present

## 2022-07-24 DIAGNOSIS — F4322 Adjustment disorder with anxiety: Secondary | ICD-10-CM | POA: Diagnosis not present

## 2022-08-07 DIAGNOSIS — F4322 Adjustment disorder with anxiety: Secondary | ICD-10-CM | POA: Diagnosis not present

## 2022-08-21 DIAGNOSIS — F4322 Adjustment disorder with anxiety: Secondary | ICD-10-CM | POA: Diagnosis not present

## 2022-09-04 DIAGNOSIS — F4322 Adjustment disorder with anxiety: Secondary | ICD-10-CM | POA: Diagnosis not present

## 2022-09-18 DIAGNOSIS — F4322 Adjustment disorder with anxiety: Secondary | ICD-10-CM | POA: Diagnosis not present

## 2022-10-16 DIAGNOSIS — F4322 Adjustment disorder with anxiety: Secondary | ICD-10-CM | POA: Diagnosis not present

## 2022-10-30 DIAGNOSIS — F4322 Adjustment disorder with anxiety: Secondary | ICD-10-CM | POA: Diagnosis not present

## 2022-11-07 ENCOUNTER — Encounter: Payer: Self-pay | Admitting: Internal Medicine

## 2022-11-07 ENCOUNTER — Ambulatory Visit: Payer: BC Managed Care – PPO | Admitting: Internal Medicine

## 2022-11-07 VITALS — BP 148/80 | HR 109 | Temp 98.3°F | Ht 67.75 in | Wt 173.4 lb

## 2022-11-07 DIAGNOSIS — M10072 Idiopathic gout, left ankle and foot: Secondary | ICD-10-CM

## 2022-11-07 DIAGNOSIS — R0789 Other chest pain: Secondary | ICD-10-CM | POA: Diagnosis not present

## 2022-11-07 DIAGNOSIS — J449 Chronic obstructive pulmonary disease, unspecified: Secondary | ICD-10-CM | POA: Diagnosis not present

## 2022-11-07 MED ORDER — ALBUTEROL SULFATE HFA 108 (90 BASE) MCG/ACT IN AERS: INHALATION_SPRAY | RESPIRATORY_TRACT | 0 refills | Status: DC

## 2022-11-07 MED ORDER — COLCHICINE 0.6 MG PO TABS: ORAL_TABLET | ORAL | 3 refills | Status: DC

## 2022-11-07 MED ORDER — FLUTICASONE-UMECLIDIN-VILANT 100-62.5-25 MCG/ACT IN AEPB: INHALATION_SPRAY | RESPIRATORY_TRACT | 3 refills | Status: DC

## 2022-11-07 NOTE — Patient Instructions (Addendum)
No change in recommendations  Please schedule a follow up visit in 12 months but call sooner if needed  

## 2022-11-07 NOTE — Progress Notes (Signed)
Subjective:     Patient ID: Jared [name], male   DOB: [birth year]  MRN: [id number]    Brief patient profile:  83  yowm  MM/quit smoking 2003 with GOLD III copd with reversible component.   History of Present Illness  03/23/2013 f/u ov/Aceyn Kathol re copd/ab Chief Complaint  Patient presents with   COPD    breathing unchanged.   using albuterol avg once a day, rarely exercises but does fine albtuerol helps when he uses it, no cramps from albuterol. rec Add singulair 10 mg one each pm for the next month to see if activity tolerance improves or need for albuterol declines if not stop the singulair and use the albuterol more aggressively, especially before exertion.      11/05/2013 f/u ov/Angelea Penny re: copd/ ab flare, did not restart singulair  Chief Complaint  Patient presents with   Acute Visit    Pt c/o increased SOB for the past wk- esp worse at bedtime and first thing in the am. He also c/o wheezing and occ prod cough with minimal green to yellow sputum.   until 2 weeks prior did not note any increase alb but x 2 weeks much more saba, esp in cold weather,  last dose 12 h prior to OV , min am discolored sputum. rec Plan A = automatic Add back  singulair 10 mg each pm to your spiriva and qvar you are already on Prednisone 10 mg take  4 each am x 2 days,   2 each am x 2 days,  1 each am x 2 days and stop  Plan B= backup = Only use your albuterol(proair) as a rescue medication         11/14/2021  f/u ov/Lucky Trotta re: copd 3  maint on trelegy  s aecopd  Chief Complaint  Patient presents with   Follow-up    1 yr f/u  Dyspnea:  teadmill nighty s sob flat grade speed register is broken  daily  Cough: none  Sleeping: flat bed one pillow  SABA use: rarely  02: none  Covid status:   never vax,  omicron  New discomfort LUQ only in upright positions, crampy x one y neg CT abd  Rec Classic subdiaphragmatic pain pattern suggests ibs:  > resolved with citrucel   Please schedule a follow up  visit in 12 months but call sooner if needed    11/07/2022  f/u ov/Valori Hollenkamp re: GOLD 3 COPD  maint on trelegy   Chief Complaint  Patient presents with   Follow-up    Doing good, needs refills  Dyspnea:  works out on treadmill x 30 min - lowest sats 94%   Cough: none Sleeping: flat one pillow  SABA use: none  Covid status:   omicron  Cp resolved on diet/ citrucel c/w IBS /splenic flex syndrome     No obvious day to day or daytime variability or assoc excess/ purulent sputum or mucus plugs or hemoptysis or cp or chest tightness, subjective wheeze or overt sinus or hb symptoms.   Sleeping as above without nocturnal  or early am exacerbation  of respiratory  c/o's or need for noct saba. Also denies any obvious fluctuation of symptoms with weather or environmental changes or other aggravating or alleviating factors except as outlined above   No unusual exposure hx or h/o childhood pna/ asthma or knowledge of premature birth.  Current Allergies, Complete Past Medical History, Past Surgical History, Family History, and Social History were reviewed in [employer]  electronic medical record.  ROS  The following are not active complaints unless bolded Hoarseness, sore throat, dysphagia, dental problems, itching, sneezing,  nasal congestion or discharge of excess mucus or purulent secretions, ear ache,   fever, chills, sweats, unintended wt loss or wt gain, classically pleuritic or exertional cp,  orthopnea pnd or arm/hand swelling  or leg swelling, presyncope, palpitations, abdominal pain, anorexia, nausea, vomiting, diarrhea  or change in bowel habits or change in bladder habits, change in stools or change in urine, dysuria, hematuria,  rash, arthralgias, visual complaints, headache, numbness, weakness or ataxia or problems with walking or coordination,  change in mood or  memory.        Current Meds  Medication Sig   albuterol (VENTOLIN HFA) 108 (90 Base) MCG/ACT inhaler INHALE 2 PUFFS BY MOUTH  EVERY 4 HOURS AS NEEDED FOR WHEEZE OR FOR SHORTNESS OF BREATH   colchicine 0.6 MG tablet TAKE 2 PILLS, THEN 1 AN HOUR LATER- REPEAT REGIMEN EVERY FEW DAYS AS NEEDED FOR GOUT ATTACK   Fluticasone-Umeclidin-Vilant (TRELEGY ELLIPTA) 100-62.5-25 MCG/ACT AEPB TAKE 1 PUFF BY MOUTH EVERY DAY                          Objective:   Physical Exam    Wts:  11/07/2022  173  11/14/2021   177  09/01/2018   158  08/29/2017   165   07/14/2014    180  03/03/2014   192  12/31/2011  196   3/302010    187   Vital signs reviewed  11/07/2022  - Note at rest 02 sats  100% on RA   General appearance:    amb wm nad    HEENT :  Oropharynx  clear      NECK :  without JVD/Nodes/TM/ nl carotid upstrokes bilaterally   LUNGS: no acc muscle use,  Mod barrel  contour chest wall with bilateral  Distant bs s audible wheeze and  without cough on insp or exp maneuvers and mod  Hyperresonant  to  percussion bilaterally     CV:  RRR  no s3 or murmur or increase in P2, and no edema   ABD:  soft and nontender with pos mid insp Hoover's  in the supine position. No bruits or organomegaly appreciated, bowel sounds nl  MS:   Ext warm without deformities or   obvious joint restrictions , calf tenderness, cyanosis or clubbing  SKIN: warm and dry without lesions    NEURO:  alert, approp, nl sensorium with  no motor or cerebellar deficits apparent.               Assessment:

## 2022-11-09 ENCOUNTER — Encounter: Payer: Self-pay | Admitting: Internal Medicine

## 2022-11-09 NOTE — Assessment & Plan Note (Signed)
Onset 2021 only in upright position - CT abd  07/05/21 neg with lower chest also nl  - 11/14/2021 rec rx as IBS  > resolved as of 11/07/2022

## 2022-11-09 NOTE — Assessment & Plan Note (Signed)
Quit smoking 2003  - alpha One AT level 137  01/25/03> genotype   03/23/13 =  MM - PFT's 03/2007 FEV 67% with ratio of 53 and DLC0 98%  - PFT's 03/23/2013  FEV 1.25 (38%) ratio of 40 and 34% p B2 to FEV1 of 51%  and DLCO 97% - PFTs 03/03/2014   FEV 1.29(36%) ratio 48 with 32% better p B2 to FEV1 48% and DLCO 103%  - HFA 100% 12/31/2011  - Singulair started 03/23/13 > > off as of 09/21/13  > worse 11/05/2013 so restarted  - trial of Breo 03/03/2014 > improved 07/14/2014 s cramping > f/u yearly  - 08/22/2015 trial off spiriva > no worse  - PFT's  08/27/2016  FEV1 1.32  (38 % ) ratio 50  p 4 % improvement from saba p Breo 100 prior to study with DLCO  96/101 % corrects to 121 % for alv volume   -trial of trelegy 09/01/2018 >>> improved as of televisit 09/03/2019 > refill x one year.   Group D (now reclassified as E) in terms of symptom/risk and laba/lama/ICS  therefore appropriate rx at this point >>>  continue trelegy and prn saba  F/u yearly - call sooner if needed         Each maintenance medication was reviewed in detail including emphasizing most importantly the difference between maintenance and prns and under what circumstances the prns are to be triggered using an action plan format where appropriate.  Total time for H and P, chart review, counseling, reviewing dpi/hfa device(s) and generating customized AVS unique to this office visit / same day charting = 25 min

## 2022-11-13 DIAGNOSIS — F4322 Adjustment disorder with anxiety: Secondary | ICD-10-CM | POA: Diagnosis not present

## 2022-11-27 DIAGNOSIS — F4322 Adjustment disorder with anxiety: Secondary | ICD-10-CM | POA: Diagnosis not present

## 2022-12-11 DIAGNOSIS — F4322 Adjustment disorder with anxiety: Secondary | ICD-10-CM | POA: Diagnosis not present

## 2023-01-08 DIAGNOSIS — F4322 Adjustment disorder with anxiety: Secondary | ICD-10-CM | POA: Diagnosis not present

## 2023-02-12 DIAGNOSIS — F4322 Adjustment disorder with anxiety: Secondary | ICD-10-CM | POA: Diagnosis not present

## 2023-03-12 DIAGNOSIS — F4322 Adjustment disorder with anxiety: Secondary | ICD-10-CM | POA: Diagnosis not present

## 2023-04-09 DIAGNOSIS — F4322 Adjustment disorder with anxiety: Secondary | ICD-10-CM | POA: Diagnosis not present

## 2023-05-07 DIAGNOSIS — F4322 Adjustment disorder with anxiety: Secondary | ICD-10-CM | POA: Diagnosis not present

## 2023-06-18 DIAGNOSIS — F4322 Adjustment disorder with anxiety: Secondary | ICD-10-CM | POA: Diagnosis not present

## 2023-07-23 DIAGNOSIS — F4322 Adjustment disorder with anxiety: Secondary | ICD-10-CM | POA: Diagnosis not present

## 2023-08-20 DIAGNOSIS — F4322 Adjustment disorder with anxiety: Secondary | ICD-10-CM | POA: Diagnosis not present

## 2023-09-10 DIAGNOSIS — F4322 Adjustment disorder with anxiety: Secondary | ICD-10-CM | POA: Diagnosis not present

## 2023-10-01 DIAGNOSIS — F4322 Adjustment disorder with anxiety: Secondary | ICD-10-CM | POA: Diagnosis not present

## 2023-10-10 ENCOUNTER — Encounter: Payer: Self-pay | Admitting: Internal Medicine

## 2023-10-10 ENCOUNTER — Ambulatory Visit: Payer: BC Managed Care – PPO | Admitting: Internal Medicine

## 2023-10-10 VITALS — BP 150/84 | HR 95 | Temp 98.9°F | Ht 67.0 in | Wt 174.8 lb

## 2023-10-10 DIAGNOSIS — J449 Chronic obstructive pulmonary disease, unspecified: Secondary | ICD-10-CM | POA: Diagnosis not present

## 2023-10-10 MED ORDER — ALBUTEROL SULFATE HFA 108 (90 BASE) MCG/ACT IN AERS: INHALATION_SPRAY | RESPIRATORY_TRACT | 0 refills | Status: DC

## 2023-10-10 MED ORDER — FLUTICASONE-UMECLIDIN-VILANT 100-62.5-25 MCG/ACT IN AEPB: INHALATION_SPRAY | RESPIRATORY_TRACT | 3 refills | Status: DC

## 2023-10-10 NOTE — Assessment & Plan Note (Signed)
Quit smoking 2003  - alpha One AT level 137  01/25/03> genotype   03/23/13 =  MM - PFT's 03/2007 FEV 67% with ratio of 53 and DLC0 98%  - PFT's 03/23/2013  FEV 1.25 (38%) ratio of 40 and 34% p B2 to FEV1 of 51%  and DLCO 97% - PFTs 03/03/2014   FEV 1.29(36%) ratio 48 with 32% better p B2 to FEV1 48% and DLCO 103%  - HFA 100% 12/31/2011  - Singulair started 03/23/13 > > off as of 09/21/13  > worse 11/05/2013 so restarted  - trial of Breo 03/03/2014 > improved 07/14/2014 s cramping > f/u yearly  - 08/22/2015 trial off spiriva > no worse  - PFT's  08/27/2016  FEV1 1.32  (38 % ) ratio 50  p 4 % improvement from saba p Breo 100 prior to study with DLCO  96/101 % corrects to 121 % for alv volume   -trial of trelegy 09/01/2018 >>> improved as of televisit 09/03/2019 > improved    Group D (now reclassified as E) in terms of symptom/risk and laba/lama/ICS  therefore appropriate rx at this point >>>  doing great on trelegy 100 with no exac, no need for saba > f/u q 12 m appropriate          Each maintenance medication was reviewed in detail including emphasizing most importantly the difference between maintenance and prns and under what circumstances the prns are to be triggered using an action plan format where appropriate.  Total time for H and P, chart review, counseling, reviewing dpi/ hfa/ pulse ox device(s) and generating customized AVS unique to this office visit / same day charting = yearly f/u

## 2023-10-10 NOTE — Patient Instructions (Signed)
No change in medications   Please schedule a follow up visit in 12  months but call sooner if needed  

## 2023-10-10 NOTE — Progress Notes (Signed)
Subjective:     Patient ID: Jared [name], male   DOB: 09-06-1956  MRN: [id number]    Brief patient profile:  49  yowm  MM/quit smoking 2003 with GOLD III copd with reversible component.   History of Present Illness  03/23/2013 f/u ov/Jared [name] re copd/ab Chief Complaint  Patient presents with   COPD    breathing unchanged.   using albuterol avg once a day, rarely exercises but does fine albtuerol helps when he uses it, no cramps from albuterol. rec Add singulair 10 mg one each pm for the next month to see if activity tolerance improves or need for albuterol declines if not stop the singulair and use the albuterol more aggressively, especially before exertion.    11/14/2021  f/u ov/Jared [name] re: copd 3  maint on trelegy  s aecopd  Chief Complaint  Patient presents with   Follow-up    1 yr f/u  Dyspnea:  teadmill nighty s sob flat grade speed register is broken  daily  Cough: none  Sleeping: flat bed one pillow  SABA use: rarely  02: none  Covid status:   never vax,  omicron  New discomfort LUQ only in upright positions, crampy x one y neg CT abd  Rec Classic subdiaphragmatic pain pattern suggests ibs:  > resolved with citrucel           10/10/2023  12 m f/u ov/Jared [name] re: GOLD 3 copd    maint on Trelegy 100  Chief Complaint  Patient presents with   Follow-up    Doing well.  Dyspnea:  treadmill 4 x weekly x 30 min starting 3 mph then decrease sustain 2.8 sats 94% RA Cough: none Sleeping: flat bed/ 1 pillow resp cc  SABA use: no need for saba  02: none      No obvious day to day or daytime variability or assoc excess/ purulent sputum or mucus plugs or hemoptysis or cp or chest tightness, subjective wheeze or overt sinus or hb symptoms.    Also denies any obvious fluctuation of symptoms with weather or environmental changes or other aggravating or alleviating factors except as outlined above   No unusual exposure hx or h/o childhood pna/ asthma or knowledge of premature  birth.  Current Allergies, Complete Past Medical History, Past Surgical History, Family History, and Social History were reviewed in [employer] record.  ROS  The following are not active complaints unless bolded Hoarseness, sore throat, dysphagia, dental problems, itching, sneezing,  nasal congestion or discharge of excess mucus or purulent secretions, ear ache,   fever, chills, sweats, unintended wt loss or wt gain, classically pleuritic or exertional cp,  orthopnea pnd or arm/hand swelling  or leg swelling, presyncope, palpitations, abdominal pain, anorexia, nausea, vomiting, diarrhea  or change in bowel habits or change in bladder habits, change in stools or change in urine, dysuria, hematuria,  rash, arthralgias, visual complaints, headache, numbness, weakness or ataxia or problems with walking or coordination,  change in mood or  memory.        Current Meds  Medication Sig   albuterol (VENTOLIN HFA) 108 (90 Base) MCG/ACT inhaler INHALE 2 PUFFS BY MOUTH EVERY 4 HOURS AS NEEDED FOR WHEEZE OR FOR SHORTNESS OF BREATH   colchicine 0.6 MG tablet TAKE 2 PILLS, THEN 1 AN HOUR LATER- REPEAT REGIMEN EVERY FEW DAYS AS NEEDED FOR GOUT ATTACK   Fluticasone-Umeclidin-Vilant (TRELEGY ELLIPTA) 100-62.5-25 MCG/ACT AEPB TAKE 1 PUFF BY MOUTH EVERY DAY  Objective:   Physical Exam    Wts:  10/10/2023    174  11/07/2022  173  11/14/2021   177  09/01/2018   158  08/29/2017   165   07/14/2014    180  03/03/2014   192  12/31/2011  196   3/302010    187   Vital signs reviewed  10/10/2023  - Note at rest 02 sats  96% on RA   General appearance:    amb pleasant wm nad   HEENT :  Oropharynx  clear       NECK :  without JVD/Nodes/TM/ nl carotid upstrokes bilaterally   LUNGS: no acc muscle use,  Mod barrel  contour chest wall with bilateral  Distant bs s audible wheeze and  without cough on insp or exp maneuvers and mod  Hyperresonant  to  percussion bilaterally      CV:  RRR  no s3 or murmur or increase in P2, and no edema   ABD:  soft and nontender with pos mid insp Hoover's  in the supine position. No bruits or organomegaly appreciated, bowel sounds nl  MS:   Ext warm without deformities or   obvious joint restrictions , calf tenderness, cyanosis or clubbing  SKIN: warm and dry without lesions    NEURO:  alert, approp, nl sensorium with  no motor or cerebellar deficits apparent.                     Assessment:

## 2023-10-22 DIAGNOSIS — F4322 Adjustment disorder with anxiety: Secondary | ICD-10-CM | POA: Diagnosis not present

## 2023-11-12 ENCOUNTER — Other Ambulatory Visit: Payer: Self-pay | Admitting: Internal Medicine

## 2023-11-19 DIAGNOSIS — F4322 Adjustment disorder with anxiety: Secondary | ICD-10-CM | POA: Diagnosis not present

## 2024-10-12 ENCOUNTER — Other Ambulatory Visit: Payer: Self-pay | Admitting: Internal Medicine

## 2024-10-12 NOTE — Telephone Encounter (Signed)
 Courtesy refill. Pt needs appt for further refills

## 2024-11-01 ENCOUNTER — Ambulatory Visit: Admitting: Internal Medicine

## 2024-11-01 ENCOUNTER — Encounter: Payer: Self-pay | Admitting: Internal Medicine

## 2024-11-01 VITALS — BP 152/84 | HR 114 | Temp 98.0°F | Ht 67.0 in | Wt 178.0 lb

## 2024-11-01 DIAGNOSIS — J449 Chronic obstructive pulmonary disease, unspecified: Secondary | ICD-10-CM | POA: Diagnosis not present

## 2024-11-01 DIAGNOSIS — M10072 Idiopathic gout, left ankle and foot: Secondary | ICD-10-CM | POA: Diagnosis not present

## 2024-11-01 MED ORDER — TRELEGY ELLIPTA 100-62.5-25 MCG/ACT IN AEPB: INHALATION_SPRAY | RESPIRATORY_TRACT | 11 refills | Status: AC

## 2024-11-01 MED ORDER — COLCHICINE 0.6 MG PO TABS: ORAL_TABLET | ORAL | 3 refills | Status: DC

## 2024-11-01 NOTE — Progress Notes (Signed)
 Subjective:     Patient ID: Jared [name], male   DOB: [birth year]  MRN: [id number]    Brief patient profile:  20  yowm  MM/quit smoking 2003 with GOLD III copd with reversible component.   History of Present Illness  03/23/2013 f/u ov/Ramelo Oetken re copd/ab Chief Complaint  Patient presents with   COPD    breathing unchanged.   using albuterol  avg once a day, rarely exercises but does fine albtuerol helps when he uses it, no cramps from albuterol . rec Add singulair  10 mg one each pm for the next month to see if activity tolerance improves or need for albuterol  declines if not stop the singulair  and use the albuterol  more aggressively, especially before exertion.    11/14/2021  f/u ov/Kamani Magnussen re: copd 3  maint on trelegy  s aecopd  Chief Complaint  Patient presents with   Follow-up    1 yr f/u  Dyspnea:  teadmill nighty s sob flat grade speed register is broken  daily  Cough: none  Sleeping: flat bed one pillow  SABA use: rarely  02: none  Covid status:   never vax,  omicron  New discomfort LUQ only in upright positions, crampy x one y neg CT abd  Rec Classic subdiaphragmatic pain pattern suggests ibs:  > resolved with citrucel        10/10/2023  12 m f/u ov/Alitza Cowman re: GOLD 3 copd    maint on Trelegy 100  Chief Complaint  Patient presents with   Follow-up    Doing well.  Dyspnea:  treadmill 4 x weekly x 30 min starting 3 mph then decrease sustain 2.8 sats 94% RA Cough: none Sleeping: flat bed/ 1 pillow resp cc  SABA use: no need for saba  02: none  Rec No change    11/01/2024 Yearly  f/u ov/Devarion Mcclanahan re: GOLD 3 copd    maint on Trelegy 100  Chief Complaint  Patient presents with   Medical Management of Chronic Issues   COPD    Breathing slightly worse- had a cold a week ago but he is improving. He has occ cough with clear sputum. He has not had to use his albuterol .   Dyspnea:  no change in treadmill Cough: mild uri one week prior to OV   Sleeping: flat bed one pillow s   resp cc  SABA use: none  02: none    No obvious day to day or daytime variability or assoc excess/ purulent sputum or mucus plugs or hemoptysis or cp or chest tightness, subjective wheeze or overt sinus or hb symptoms.    Also denies any obvious fluctuation of symptoms with weather or environmental changes or other aggravating or alleviating factors except as outlined above   No unusual exposure hx or h/o childhood pna/ asthma or knowledge of premature birth.  Current Allergies, Complete Past Medical History, Past Surgical History, Family History, and Social History were reviewed in [employer] record.  ROS  The following are not active complaints unless bolded Hoarseness, sore throat, dysphagia, dental problems, itching, sneezing,  nasal congestion or discharge of excess mucus or purulent secretions, ear ache,   fever, chills, sweats, unintended wt loss or wt gain, classically pleuritic or exertional cp,  orthopnea pnd or arm/hand swelling  or leg swelling, presyncope, palpitations, abdominal pain, anorexia, nausea, vomiting, diarrhea  or change in bowel habits or change in bladder habits, change in stools or change in urine, dysuria, hematuria,  rash, arthralgias, visual complaints, headache,  numbness, weakness or ataxia or problems with walking or coordination,  change in mood or  memory.        Current Meds  Medication Sig   albuterol  (VENTOLIN  HFA) 108 (90 Base) MCG/ACT inhaler INHALE 2 PUFFS BY MOUTH EVERY 4 HOURS AS NEEDED FOR WHEEZE OR FOR SHORTNESS OF BREATH   colchicine  0.6 MG tablet TAKE 2 PILLS, THEN 1 AN HOUR LATER- REPEAT REGIMEN EVERY FEW DAYS AS NEEDED FOR GOUT ATTACK   Fluticasone -Umeclidin-Vilant (TRELEGY ELLIPTA ) 100-62.5-25 MCG/ACT AEPB INHALE 1 PUFF BY MOUTH EVERY DAY                Objective:   Physical Exam    Wts:  11/01/2024   178  10/10/2023    174  11/07/2022  173  11/14/2021   177  09/01/2018   158  08/29/2017   165   07/14/2014     180  03/03/2014   192  12/31/2011  196   3/302010    187   Vital signs reviewed  11/01/2024  - Note at rest 02 sats  95% on RA   General appearance:    amb wm nad  HEENT :  Oropharynx  clear       NECK :  without JVD/Nodes/TM/ nl carotid upstrokes bilaterally   LUNGS: no acc muscle use,  Mod barrel  contour chest wall with bilateral  Distant bs s audible wheeze and  without cough on insp or exp maneuvers and mod  Hyperresonant  to  percussion bilaterally     CV:  RRR  no s3 or murmur or increase in P2, and no edema   ABD:  soft and nontender with pos mid insp Hoover's  in the supine position. No bruits or organomegaly appreciated, bowel sounds nl  MS:   Ext warm without deformities or   obvious joint restrictions , calf tenderness, cyanosis or clubbing  SKIN: warm and dry without lesions    NEURO:  alert, approp, nl sensorium with  no motor or cerebellar deficits apparent.                Assessment:     Assessment & Plan Acute idiopathic gout of left foot  COPD GOLD III with reversibility  Quit smoking 2003  - alpha One AT level 137  01/25/03> genotype   03/23/13 =  MM - PFT's 03/2007 FEV 67% with ratio of 53 and DLC0 98%  - PFT's 03/23/2013  FEV 1.25 (38%) ratio of 40 and 34% p B2 to FEV1 of 51%  and DLCO 97% - PFTs 03/03/2014   FEV 1.29(36%) ratio 48 with 32% better p B2 to FEV1 48% and DLCO 103%  - HFA 100% 12/31/2011  - Singulair  started 03/23/13 > > off as of 09/21/13  > worse 11/05/2013 so restarted  - trial of Breo 03/03/2014 > improved 07/14/2014 s cramping > f/u yearly  - 08/22/2015 trial off spiriva > no worse  - PFT's  08/27/2016  FEV1 1.32  (38 % ) ratio 50  p 4 % improvement from saba p Breo 100 prior to study with DLCO  96/101 % corrects to 121 % for alv volume   -trial of trelegy 09/01/2018 >>> improved as of televisit 09/03/2019 > improved   Good ex tol and no flare sympotms or saba need with recent uri.   Group E in terms of symptoms/risk so  laba/lama/ICS   therefore appropriate rx at this point >>>  continue trelegy   and  approp SABA prn and f/u here q 12 m,  call sooner prn   Advised on keeping up with vaccinations and Health maint recs per PCP         Each maintenance medication was reviewed in detail including emphasizing most importantly the difference between maintenance and prns and under what circumstances the prns are to be triggered using an action plan format where appropriate.  Total time for H and P, chart review, counseling, reviewing hfa device(s) and generating customized AVS unique to this office visit / same day charting = 20 min           AVS  Patient Instructions  No change in medications  Keep up with your primary care follow up   Please schedule a follow up visit in 12  months but call sooner if needed    Ozell America, MD 11/01/2024

## 2024-11-01 NOTE — Patient Instructions (Signed)
 No change in medications  Keep up with your primary care follow up   Please schedule a follow up visit in 12  months but call sooner if needed

## 2024-11-01 NOTE — Assessment & Plan Note (Addendum)
 Quit smoking 2003  - alpha One AT level 137  01/25/03> genotype   03/23/13 =  MM - PFT's 03/2007 FEV 67% with ratio of 53 and DLC0 98%  - PFT's 03/23/2013  FEV 1.25 (38%) ratio of 40 and 34% p B2 to FEV1 of 51%  and DLCO 97% - PFTs 03/03/2014   FEV 1.29(36%) ratio 48 with 32% better p B2 to FEV1 48% and DLCO 103%  - HFA 100% 12/31/2011  - Singulair  started 03/23/13 > > off as of 09/21/13  > worse 11/05/2013 so restarted  - trial of Breo 03/03/2014 > improved 07/14/2014 s cramping > f/u yearly  - 08/22/2015 trial off spiriva > no worse  - PFT's  08/27/2016  FEV1 1.32  (38 % ) ratio 50  p 4 % improvement from saba p Breo 100 prior to study with DLCO  96/101 % corrects to 121 % for alv volume   -trial of trelegy 09/01/2018 >>> improved as of televisit 09/03/2019 > improved   Good ex tol and no flare sympotms or saba need with recent uri.   Group E in terms of symptoms/risk so  laba/lama/ICS  therefore appropriate rx at this point >>>  continue trelegy   and approp SABA prn and f/u here q 12 m,  call sooner prn   Advised on keeping up with vaccinations and Health maint recs per PCP         Each maintenance medication was reviewed in detail including emphasizing most importantly the difference between maintenance and prns and under what circumstances the prns are to be triggered using an action plan format where appropriate.  Total time for H and P, chart review, counseling, reviewing hfa device(s) and generating customized AVS unique to this office visit / same day charting = 20 min

## 2024-11-12 ENCOUNTER — Other Ambulatory Visit: Payer: Self-pay | Admitting: Internal Medicine

## 2024-11-12 DIAGNOSIS — M10072 Idiopathic gout, left ankle and foot: Secondary | ICD-10-CM

## 2024-11-17 ENCOUNTER — Other Ambulatory Visit: Payer: Self-pay

## 2024-11-17 DIAGNOSIS — M10072 Idiopathic gout, left ankle and foot: Secondary | ICD-10-CM

## 2024-11-17 MED ORDER — COLCHICINE 0.6 MG PO TABS: ORAL_TABLET | ORAL | 3 refills | Status: AC

## 2024-12-14 ENCOUNTER — Ambulatory Visit: Admitting: Adult Health

## 2024-12-14 ENCOUNTER — Encounter: Payer: Self-pay | Admitting: Adult Health

## 2024-12-14 ENCOUNTER — Ambulatory Visit: Payer: Self-pay | Admitting: Internal Medicine

## 2024-12-14 VITALS — Ht 67.75 in | Wt 175.0 lb

## 2024-12-14 DIAGNOSIS — J209 Acute bronchitis, unspecified: Secondary | ICD-10-CM

## 2024-12-14 DIAGNOSIS — Z87891 Personal history of nicotine dependence: Secondary | ICD-10-CM | POA: Diagnosis not present

## 2024-12-14 DIAGNOSIS — J441 Chronic obstructive pulmonary disease with (acute) exacerbation: Secondary | ICD-10-CM

## 2024-12-14 MED ORDER — PREDNISONE 10 MG PO TABS: ORAL_TABLET | ORAL | 0 refills | Status: AC

## 2024-12-14 MED ORDER — DOXYCYCLINE HYCLATE 100 MG PO TABS: 100.0000 mg | ORAL_TABLET | Freq: Two times a day (BID) | ORAL | 0 refills | Status: AC

## 2024-12-14 NOTE — Telephone Encounter (Signed)
 Noted. Nothing further needed.

## 2024-12-14 NOTE — Telephone Encounter (Signed)
 [facility] Pulmonary Triage - Initial Assessment Questions Chief Complaint (e.g., cough, sob, wheezing, fever, chills, sweat or additional symptoms) *Go to specific symptom protocol after initial questions. Productive cough, fatigue, some wheezing  How long have symptoms been present? X1-1.5 weeks  Have you tested for COVID or Flu? Note: If not, ask patient if a home test can be taken. If so, instruct patient to call back for positive results. No  MEDICINES:   Have you used any OTC meds to help with symptoms? Yes If yes, ask What medications? Advil  Have you used your inhalers/maintenance medication? Yes If yes, What medications? Trelegy Ellipta  and albuterol  inhaler  If inhaler, ask How many puffs and how often? Note: Review instructions on medication in the chart. Using albuterol  2 puffs once in the AM and once in the evening; trelegy is once daily.  OXYGEN: Do you wear supplemental oxygen? No If yes, How many liters are you supposed to use? N/A  Do you monitor your oxygen levels? Yes If yes, What is your reading (oxygen level) today? 95%  What is your usual oxygen saturation reading?  (Note: Pulmonary O2 sats should be 90% or greater) Mid to high 90s    Copied from CRM #[id number]. Topic: Clinical - Red Word Triage >> Dec 14, 2024  9:25 AM Benton KIDD wrote: [facility] that prompted transfer to Nurse Triage: patient wife tena calling because patient having hard time to breath coughing up green phlegm and no energy and feeling weak  Dr wert on [employer] Reason for Disposition  Wheezing is present  Answer Assessment - Initial Assessment Questions E2C2 Pulmonary Triage - Initial Assessment Questions Chief Complaint (e.g., cough, sob, wheezing, fever, chills, sweat or additional symptoms) *Go to specific symptom protocol after initial questions. Productive cough, fatigue, some wheezing  How long have symptoms been present? X1-1.5 weeks  Have you tested  for COVID or Flu? Note: If not, ask patient if a home test can be taken. If so, instruct patient to call back for positive results. No  MEDICINES:   Have you used any OTC meds to help with symptoms? Yes If yes, ask What medications? Advil  Have you used your inhalers/maintenance medication? Yes If yes, What medications? Trelegy Ellipta  and albuterol  inhaler  If inhaler, ask How many puffs and how often? Note: Review instructions on medication in the chart. Using albuterol  2 puffs once in the AM and once in the evening; trelegy is once daily.  OXYGEN: Do you wear supplemental oxygen? No If yes, How many liters are you supposed to use? N/A  Do you monitor your oxygen levels? Yes If yes, What is your reading (oxygen level) today? 95%  What is your usual oxygen saturation reading?  (Note: Pulmonary O2 sats should be 90% or greater) Mid to high 90s      1. ONSET: When did the cough begin?      1-1.5 weeks ago when the weather turned cold.  2. SEVERITY: How bad is the cough today?      Moderate, states it doesn't keep him up at night.  3. SPUTUM: Describe the color of your sputum (e.g., none, dry cough; clear, white, yellow, green)     Green.  4. HEMOPTYSIS: Are you coughing up any blood? If Yes, ask: How much? (e.g., flecks, streaks, tablespoons, etc.)     No.  5. DIFFICULTY BREATHING: Are you having difficulty breathing? If Yes, ask: How bad is it? (e.g., mild, moderate, severe)  Not worse than usual  6. FEVER: Do you have a fever? If Yes, ask: What is your temperature, how was it measured, and when did it start?     No. Highest temp was 99.6 with oral thermometer.  7. CARDIAC HISTORY: Do you have any history of heart disease? (e.g., heart attack, congestive heart failure)      No.  8. LUNG HISTORY: Do you have any history of lung disease?  (e.g., pulmonary embolus, asthma, emphysema)     COPD, asthma  9. PE RISK  FACTORS: Do you have a history of blood clots? (or: recent major surgery, recent prolonged travel, bedridden)     No.  10. OTHER SYMPTOMS: Do you have any other symptoms? (e.g., runny nose, wheezing, chest pain)       Some wheezing, fatigue. No chest pain or fever.  11. PREGNANCY: Is there any chance you are pregnant? When was your last menstrual period?       N/A.  12. TRAVEL: Have you traveled out of the country in the last month? (e.g., travel history, exposures)       No.  Protocols used: Cough - Acute Productive-A-AH

## 2024-12-14 NOTE — Progress Notes (Signed)
 Virtual Visit via Video Note  I connected with Jared [name] on 12/14/2024 at 10:00 AM EST by a video enabled telemedicine application and verified that I am speaking with the correct person using two identifiers.  Location: Patient: Home  Provider: Office    I discussed the limitations of evaluation and management by telemedicine and the availability of in person appointments. The patient expressed understanding and agreed to proceed.  History of Present Illness: 69 year old male former smoker followed for severe COPD with asthma overlap  Today's video visit is for an acute office visit. Complains of 10 days of productive cough with thick mucus. Coughing up thick green mucus. Running low grade fever, tmax 99.9.   Has started using his albuterol  inhaler last few days which has helped some. Has intermittent wheezing and tightness. Symptoms are not improving seem to be getting worse. Good appetite with no n/v/d. No known sick contacts or travel. Not using otc meds. Has severe COPD remains on Trelegy, endorses compliance. No recent antibiotic or steroid use.  No hemoptysis. Has not tested for flu or covid but says he does not feel body aches or like he has the flu.       Observations/Objective: Chest x-ray July 2022 clear lungs  12/14/2024 appears well in nad , speaking in full sentences   Assessment and Plan: Acute COPD exacerbation with acute bronchitis -ongoing symptoms of cough/congestion/low grade fever for 10 days. Has good appetite without GI symptoms. Recommend empiric antibiotics and steroid taper. Continue on Triple therapy inhaler with Trelegy. Albuterol  for rescue use. If not improving in next 24-48 hr needs in person office visit with chest xray and evaluation -patient aware to call back if not improving or worsens . High risk for decompensation. Continue with symptom management and supportive care.  Home test for flu and covid, call back if results positive although sx >10 days .    Plan  Patient Instructions  Begin Doxycycline  100mg  Twice daily  for 1 week, take with food  Begin Prednisone  taper over next week. Take with food.  Liquid Mucinex  DM Twice daily  As needed  cough/congestion  Allegra 180mg  daily for drainage As needed   Saline nasal spray As needed   Fluids and rest  Tylenol  As needed   Continue on Trelegy 1 puff daily  Albuterol  inhaler As needed   Follow up with Dr. Darlean or Hari Casaus NP  in 4-6 weeks and As needed   Please contact office for sooner follow up if symptoms do not improve or worsen or seek emergency care      Follow Up Instructions:    I discussed the assessment and treatment plan with the patient. The patient was provided an opportunity to ask questions and all were answered. The patient agreed with the plan and demonstrated an understanding of the instructions.   The patient was advised to call back or seek an in-person evaluation if the symptoms worsen or if the condition fails to improve as anticipated.  I provided 30  minutes of non-face-to-face time during this encounter.   Madelin Stank, NP

## 2024-12-14 NOTE — Patient Instructions (Addendum)
 Begin Doxycycline  100mg  Twice daily  for 1 week, take with food  Begin Prednisone  taper over next week. Take with food.  Liquid Mucinex  DM Twice daily  As needed  cough/congestion  Allegra 180mg  daily for drainage As needed   Saline nasal spray As needed   Fluids and rest  Tylenol  As needed   Continue on Trelegy 1 puff daily  Albuterol  inhaler As needed   Follow up with Dr. Darlean or Ules Marsala NP  in 4-6 weeks and As needed   Please contact office for sooner follow up if symptoms do not improve or worsen or seek emergency care

## 2025-01-05 ENCOUNTER — Other Ambulatory Visit: Payer: Self-pay | Admitting: Internal Medicine

## 2025-01-05 ENCOUNTER — Ambulatory Visit: Payer: Self-pay | Admitting: Internal Medicine

## 2025-01-05 MED ORDER — PREDNISONE 10 MG PO TABS: ORAL_TABLET | ORAL | 0 refills | Status: AC

## 2025-01-05 NOTE — Telephone Encounter (Signed)
 FYI Only or Action Required?: Action required by provider: request for appointment, clinical question for provider, and update on patient condition.requesting meds and/or virtual visit  Patient is followed in Pulmonology for COPD and asthma, last seen on 12/14/2024 by Parrett, Madelin RAMAN, NP.  Called Nurse Triage reporting Shortness of Breath.  Symptoms began several days ago.  Interventions attempted: Increased fluids/rest.  Symptoms are: gradually worsening.  Triage Disposition: See HCP Within 4 Hours (Or PCP Triage)  Patient/caregiver understands and will follow disposition?: Yes        Reason for Disposition  [1] Longstanding difficulty breathing (e.g., CHF, COPD, emphysema) AND [2] WORSE than normal  Answer Assessment - Initial Assessment Questions This RN recommended pt be examined in next 4 hours, pt refusing disposition, requesting prednisone  and doxycycline  be sent to [facility] on [address] in [place] and/or virtual visit if needed. Advised pt call back or seek immediate care if any new or worsening symptoms. Sending message to PCP office for call back to pt with further recommendations.      1. RESPIRATORY STATUS: Describe your breathing? (e.g., wheezing, shortness of breath, unable to speak, severe coughing)      Worse SOB than usual even at rest Coughing fits Coughing up yellow/bright orange mucus, very thick sputum, denies coughing up blood Wheezing some  2. ONSET: When did this breathing problem begin?      Over last few days but morning was first time with real bad symptoms  3. PATTERN Does the difficult breathing come and go, or has it been constant since it started?      Constant  5. RECURRENT SYMPTOM: Have you had difficulty breathing before? If Yes, ask: When was the last time? and What happened that time?      Pt states these current symptoms are his usual for bronchitis, last time prednisone  and doxycyline worked really well  7. LUNG HISTORY:  Do you have any history of lung disease?  (e.g., pulmonary embolus, asthma, emphysema)     COPD and asthma  8. CAUSE: What do you think is causing the breathing problem?      Bronchitis again, get trouble when cold weather and heat on in house  9. OTHER SYMPTOMS: Do you have any other symptoms? (e.g., chest pain, cough, dizziness, fever, runny nose)     Lungs burn a little bit - just with breathing, just feel burning, pretty constant - feels like usual with bronchitis attack  10. O2 SATURATION MONITOR:  Do you use an oxygen saturation monitor (pulse oximeter) at home? If Yes, ask: What is your reading (oxygen level) today? What is your usual oxygen saturation reading? (e.g., 95%)       93-94% this morning, usually 94-96%  Denies: Struggling to breathe Confusion/slurred speech Passing out Chest pain Fever  Protocols used: Breathing Difficulty-A-AH

## 2025-01-05 NOTE — Telephone Encounter (Signed)
 Can't really treat this over the phone effectively or virtually but ok to try another 6 d of prednisone  and be sure following Action plan re use of albuterol     I called and spoke with the pt and notified of response from Dr Darlean  He verbalized understanding  Rx was sent to pharm
